# Patient Record
Sex: Female | Born: 1960 | Race: Black or African American | Hispanic: No | Smoking: Former smoker
Health system: Southern US, Community
[De-identification: ages and names within clinical notes are randomized; demographics above are authoritative.]

## PROBLEM LIST (undated history)

## (undated) DIAGNOSIS — F1010 Alcohol abuse, uncomplicated: Secondary | ICD-10-CM

## (undated) DIAGNOSIS — D689 Coagulation defect, unspecified: Secondary | ICD-10-CM

## (undated) DIAGNOSIS — T7840XA Allergy, unspecified, initial encounter: Secondary | ICD-10-CM

## (undated) DIAGNOSIS — C801 Malignant (primary) neoplasm, unspecified: Secondary | ICD-10-CM

## (undated) HISTORY — DX: Allergy, unspecified, initial encounter: T78.40XA

## (undated) HISTORY — DX: Coagulation defect, unspecified: D68.9

## (undated) HISTORY — PX: BREAST SURGERY: SHX581

## (undated) HISTORY — PX: APPENDECTOMY: SHX54

## (undated) HISTORY — DX: Alcohol abuse, uncomplicated: F10.10

## (undated) HISTORY — DX: Malignant (primary) neoplasm, unspecified: C80.1

---

## 1998-07-21 ENCOUNTER — Encounter (HOSPITAL_COMMUNITY): Admission: RE | Admit: 1998-07-21 | Discharge: 1998-10-19 | Payer: Self-pay | Admitting: Psychiatry

## 1998-10-31 ENCOUNTER — Emergency Department (HOSPITAL_COMMUNITY): Admission: EM | Admit: 1998-10-31 | Discharge: 1998-10-31 | Payer: Self-pay | Admitting: Emergency Medicine

## 1999-06-30 ENCOUNTER — Other Ambulatory Visit: Admission: RE | Admit: 1999-06-30 | Discharge: 1999-06-30 | Payer: Self-pay | Admitting: *Deleted

## 1999-12-04 ENCOUNTER — Encounter: Payer: Self-pay | Admitting: Obstetrics and Gynecology

## 1999-12-04 ENCOUNTER — Inpatient Hospital Stay (HOSPITAL_COMMUNITY): Admission: AD | Admit: 1999-12-04 | Discharge: 1999-12-04 | Payer: Self-pay | Admitting: Obstetrics and Gynecology

## 2000-07-04 ENCOUNTER — Other Ambulatory Visit: Admission: RE | Admit: 2000-07-04 | Discharge: 2000-07-04 | Payer: Self-pay | Admitting: Obstetrics and Gynecology

## 2001-06-06 ENCOUNTER — Other Ambulatory Visit: Admission: RE | Admit: 2001-06-06 | Discharge: 2001-06-06 | Payer: Self-pay | Admitting: Obstetrics and Gynecology

## 2001-07-03 ENCOUNTER — Other Ambulatory Visit: Admission: RE | Admit: 2001-07-03 | Discharge: 2001-07-03 | Payer: Self-pay | Admitting: Obstetrics and Gynecology

## 2001-07-03 ENCOUNTER — Encounter (INDEPENDENT_AMBULATORY_CARE_PROVIDER_SITE_OTHER): Payer: Self-pay

## 2002-06-14 ENCOUNTER — Other Ambulatory Visit: Admission: RE | Admit: 2002-06-14 | Discharge: 2002-06-14 | Payer: Self-pay | Admitting: Obstetrics and Gynecology

## 2002-12-05 DIAGNOSIS — C801 Malignant (primary) neoplasm, unspecified: Secondary | ICD-10-CM

## 2002-12-05 HISTORY — DX: Malignant (primary) neoplasm, unspecified: C80.1

## 2003-06-17 ENCOUNTER — Other Ambulatory Visit: Admission: RE | Admit: 2003-06-17 | Discharge: 2003-06-17 | Payer: Self-pay | Admitting: Obstetrics and Gynecology

## 2003-06-26 ENCOUNTER — Encounter: Payer: Self-pay | Admitting: Obstetrics and Gynecology

## 2003-06-26 ENCOUNTER — Ambulatory Visit (HOSPITAL_COMMUNITY): Admission: RE | Admit: 2003-06-26 | Discharge: 2003-06-26 | Payer: Self-pay | Admitting: Obstetrics and Gynecology

## 2003-07-02 ENCOUNTER — Encounter: Admission: RE | Admit: 2003-07-02 | Discharge: 2003-07-02 | Payer: Self-pay | Admitting: Obstetrics and Gynecology

## 2003-07-02 ENCOUNTER — Encounter: Payer: Self-pay | Admitting: Obstetrics and Gynecology

## 2003-07-02 ENCOUNTER — Encounter (INDEPENDENT_AMBULATORY_CARE_PROVIDER_SITE_OTHER): Payer: Self-pay | Admitting: *Deleted

## 2003-07-09 ENCOUNTER — Encounter (HOSPITAL_BASED_OUTPATIENT_CLINIC_OR_DEPARTMENT_OTHER): Payer: Self-pay | Admitting: General Surgery

## 2003-07-09 ENCOUNTER — Encounter: Admission: RE | Admit: 2003-07-09 | Discharge: 2003-07-09 | Payer: Self-pay | Admitting: General Surgery

## 2003-07-11 ENCOUNTER — Encounter (HOSPITAL_BASED_OUTPATIENT_CLINIC_OR_DEPARTMENT_OTHER): Payer: Self-pay | Admitting: General Surgery

## 2003-07-11 ENCOUNTER — Encounter (INDEPENDENT_AMBULATORY_CARE_PROVIDER_SITE_OTHER): Payer: Self-pay | Admitting: Specialist

## 2003-07-11 ENCOUNTER — Ambulatory Visit (HOSPITAL_BASED_OUTPATIENT_CLINIC_OR_DEPARTMENT_OTHER): Admission: RE | Admit: 2003-07-11 | Discharge: 2003-07-11 | Payer: Self-pay | Admitting: General Surgery

## 2003-07-11 ENCOUNTER — Encounter: Admission: RE | Admit: 2003-07-11 | Discharge: 2003-07-11 | Payer: Self-pay | Admitting: General Surgery

## 2003-08-14 ENCOUNTER — Ambulatory Visit (HOSPITAL_COMMUNITY): Admission: RE | Admit: 2003-08-14 | Discharge: 2003-08-14 | Payer: Self-pay | Admitting: Oncology

## 2003-08-14 ENCOUNTER — Encounter: Payer: Self-pay | Admitting: Oncology

## 2003-09-30 ENCOUNTER — Ambulatory Visit: Admission: RE | Admit: 2003-09-30 | Discharge: 2003-12-12 | Payer: Self-pay | Admitting: Radiation Oncology

## 2003-10-09 ENCOUNTER — Encounter: Admission: RE | Admit: 2003-10-09 | Discharge: 2003-10-09 | Payer: Self-pay | Admitting: Radiation Oncology

## 2003-12-30 ENCOUNTER — Ambulatory Visit: Admission: RE | Admit: 2003-12-30 | Discharge: 2003-12-30 | Payer: Self-pay | Admitting: Radiation Oncology

## 2004-05-05 ENCOUNTER — Encounter: Admission: RE | Admit: 2004-05-05 | Discharge: 2004-05-05 | Payer: Self-pay | Admitting: Oncology

## 2004-06-09 ENCOUNTER — Ambulatory Visit: Admission: RE | Admit: 2004-06-09 | Discharge: 2004-06-09 | Payer: Self-pay | Admitting: Radiation Oncology

## 2004-06-30 ENCOUNTER — Other Ambulatory Visit: Admission: RE | Admit: 2004-06-30 | Discharge: 2004-06-30 | Payer: Self-pay | Admitting: Obstetrics and Gynecology

## 2004-10-15 ENCOUNTER — Ambulatory Visit: Payer: Self-pay | Admitting: Family Medicine

## 2005-02-22 ENCOUNTER — Ambulatory Visit: Payer: Self-pay | Admitting: Oncology

## 2005-05-09 ENCOUNTER — Encounter: Admission: RE | Admit: 2005-05-09 | Discharge: 2005-05-09 | Payer: Self-pay | Admitting: Oncology

## 2005-07-05 ENCOUNTER — Other Ambulatory Visit: Admission: RE | Admit: 2005-07-05 | Discharge: 2005-07-05 | Payer: Self-pay | Admitting: Obstetrics and Gynecology

## 2006-02-24 ENCOUNTER — Ambulatory Visit: Payer: Self-pay | Admitting: Oncology

## 2006-05-15 ENCOUNTER — Encounter: Admission: RE | Admit: 2006-05-15 | Discharge: 2006-05-15 | Payer: Self-pay | Admitting: Oncology

## 2006-05-29 ENCOUNTER — Encounter: Admission: RE | Admit: 2006-05-29 | Discharge: 2006-05-29 | Payer: Self-pay | Admitting: Oncology

## 2006-07-10 ENCOUNTER — Other Ambulatory Visit: Admission: RE | Admit: 2006-07-10 | Discharge: 2006-07-10 | Payer: Self-pay | Admitting: Obstetrics and Gynecology

## 2007-02-15 ENCOUNTER — Ambulatory Visit: Payer: Self-pay | Admitting: Oncology

## 2007-02-19 LAB — CBC WITH DIFFERENTIAL/PLATELET
Eosinophils Absolute: 0.1 10*3/uL (ref 0.0–0.5)
MCV: 93.6 fL (ref 81.0–101.0)
MONO%: 8 % (ref 0.0–13.0)
NEUT#: 2.3 10*3/uL (ref 1.5–6.5)
RBC: 3.96 10*6/uL (ref 3.70–5.32)
RDW: 12.9 % (ref 11.3–14.5)
WBC: 4.4 10*3/uL (ref 3.9–10.0)

## 2007-02-19 LAB — COMPREHENSIVE METABOLIC PANEL
ALT: 27 U/L (ref 0–35)
AST: 20 U/L (ref 0–37)
Alkaline Phosphatase: 59 U/L (ref 39–117)
CO2: 24 mEq/L (ref 19–32)
Sodium: 137 mEq/L (ref 135–145)
Total Bilirubin: 0.5 mg/dL (ref 0.3–1.2)
Total Protein: 7.4 g/dL (ref 6.0–8.3)

## 2007-02-19 LAB — CANCER ANTIGEN 27.29: CA 27.29: 18 U/mL (ref 0–39)

## 2007-05-21 ENCOUNTER — Encounter: Admission: RE | Admit: 2007-05-21 | Discharge: 2007-05-21 | Payer: Self-pay | Admitting: Oncology

## 2007-12-10 ENCOUNTER — Emergency Department (HOSPITAL_COMMUNITY): Admission: EM | Admit: 2007-12-10 | Discharge: 2007-12-10 | Payer: Self-pay | Admitting: Emergency Medicine

## 2008-01-24 ENCOUNTER — Ambulatory Visit: Payer: Self-pay | Admitting: Cardiology

## 2008-02-06 ENCOUNTER — Ambulatory Visit: Payer: Self-pay | Admitting: Cardiology

## 2008-02-06 ENCOUNTER — Ambulatory Visit: Payer: Self-pay

## 2008-02-06 ENCOUNTER — Encounter: Payer: Self-pay | Admitting: Cardiology

## 2008-02-14 ENCOUNTER — Ambulatory Visit: Payer: Self-pay | Admitting: Oncology

## 2008-02-18 LAB — CBC WITH DIFFERENTIAL/PLATELET
BASO%: 0.3 % (ref 0.0–2.0)
Basophils Absolute: 0 10*3/uL (ref 0.0–0.1)
EOS%: 3.2 % (ref 0.0–7.0)
Eosinophils Absolute: 0.2 10*3/uL (ref 0.0–0.5)
HCT: 37.3 % (ref 34.8–46.6)
MONO#: 0.3 10*3/uL (ref 0.1–0.9)
NEUT#: 2.7 10*3/uL (ref 1.5–6.5)
NEUT%: 54 % (ref 39.6–76.8)
Platelets: 237 10*3/uL (ref 145–400)
RDW: 12.9 % (ref 11.3–14.5)
WBC: 5 10*3/uL (ref 3.9–10.0)
lymph#: 1.8 10*3/uL (ref 0.9–3.3)

## 2008-02-18 LAB — COMPREHENSIVE METABOLIC PANEL
Albumin: 4.4 g/dL (ref 3.5–5.2)
BUN: 10 mg/dL (ref 6–23)
Calcium: 9.8 mg/dL (ref 8.4–10.5)
Chloride: 107 mEq/L (ref 96–112)
Glucose, Bld: 119 mg/dL — ABNORMAL HIGH (ref 70–99)
Potassium: 4.4 mEq/L (ref 3.5–5.3)

## 2008-05-26 ENCOUNTER — Encounter: Admission: RE | Admit: 2008-05-26 | Discharge: 2008-05-26 | Payer: Self-pay | Admitting: Obstetrics and Gynecology

## 2009-02-12 ENCOUNTER — Ambulatory Visit: Payer: Self-pay | Admitting: Oncology

## 2009-02-16 LAB — CBC WITH DIFFERENTIAL/PLATELET
Basophils Absolute: 0 10*3/uL (ref 0.0–0.1)
EOS%: 2.7 % (ref 0.0–7.0)
LYMPH%: 56.2 % — ABNORMAL HIGH (ref 14.0–49.7)
MONO#: 0.4 10*3/uL (ref 0.1–0.9)
NEUT%: 31 % — ABNORMAL LOW (ref 38.4–76.8)
RBC: 3.94 10*6/uL (ref 3.70–5.45)
WBC: 4 10*3/uL (ref 3.9–10.3)

## 2009-02-16 LAB — COMPREHENSIVE METABOLIC PANEL
AST: 38 U/L — ABNORMAL HIGH (ref 0–37)
Albumin: 3.8 g/dL (ref 3.5–5.2)
BUN: 13 mg/dL (ref 6–23)
Calcium: 8.8 mg/dL (ref 8.4–10.5)
Creatinine, Ser: 0.71 mg/dL (ref 0.40–1.20)

## 2009-02-16 LAB — CANCER ANTIGEN 27.29: CA 27.29: 20 U/mL (ref 0–39)

## 2009-03-02 ENCOUNTER — Encounter: Admission: RE | Admit: 2009-03-02 | Discharge: 2009-03-02 | Payer: Self-pay | Admitting: Oncology

## 2009-03-02 LAB — COMPREHENSIVE METABOLIC PANEL
ALT: 42 U/L — ABNORMAL HIGH (ref 0–35)
AST: 29 U/L (ref 0–37)
Chloride: 103 mEq/L (ref 96–112)
Glucose, Bld: 130 mg/dL — ABNORMAL HIGH (ref 70–99)
Potassium: 3.3 mEq/L — ABNORMAL LOW (ref 3.5–5.3)

## 2009-06-01 ENCOUNTER — Encounter: Admission: RE | Admit: 2009-06-01 | Discharge: 2009-06-01 | Payer: Self-pay | Admitting: Oncology

## 2010-06-02 ENCOUNTER — Encounter: Admission: RE | Admit: 2010-06-02 | Discharge: 2010-06-02 | Payer: Self-pay | Admitting: Obstetrics and Gynecology

## 2010-08-05 ENCOUNTER — Ambulatory Visit: Payer: Self-pay | Admitting: Oncology

## 2010-08-10 LAB — CBC WITH DIFFERENTIAL/PLATELET
BASO%: 0.6 % (ref 0.0–2.0)
Basophils Absolute: 0 10*3/uL (ref 0.0–0.1)
HGB: 12 g/dL (ref 11.6–15.9)
LYMPH%: 53.6 % — ABNORMAL HIGH (ref 14.0–49.7)
MCH: 30.7 pg (ref 25.1–34.0)
MCHC: 33.4 g/dL (ref 31.5–36.0)
MCV: 91.9 fL (ref 79.5–101.0)
MONO%: 11.9 % (ref 0.0–14.0)
NEUT#: 0.9 10*3/uL — ABNORMAL LOW (ref 1.5–6.5)
RDW: 13.6 % (ref 11.2–14.5)
lymph#: 1.6 10*3/uL (ref 0.9–3.3)

## 2010-08-10 LAB — MORPHOLOGY

## 2010-08-10 LAB — SEDIMENTATION RATE: Sed Rate: 26 mm/hr — ABNORMAL HIGH (ref 0–22)

## 2010-08-15 LAB — HEMOCHROMATOSIS DNA-PCR(C282Y,H63D)

## 2010-08-19 ENCOUNTER — Encounter: Admission: RE | Admit: 2010-08-19 | Discharge: 2010-08-19 | Payer: Self-pay | Admitting: Family Medicine

## 2010-08-31 LAB — CBC WITH DIFFERENTIAL/PLATELET
BASO%: 0.2 % (ref 0.0–2.0)
Basophils Absolute: 0 10*3/uL (ref 0.0–0.1)
HCT: 35.4 % (ref 34.8–46.6)
HGB: 11.2 g/dL — ABNORMAL LOW (ref 11.6–15.9)
LYMPH%: 38.8 % (ref 14.0–49.7)
MCH: 29.5 pg (ref 25.1–34.0)
MCV: 93.2 fL (ref 79.5–101.0)
MONO#: 0.4 10*3/uL (ref 0.1–0.9)
MONO%: 8.1 % (ref 0.0–14.0)
NEUT#: 2.3 10*3/uL (ref 1.5–6.5)
NEUT%: 50.1 % (ref 38.4–76.8)
RBC: 3.8 10*6/uL (ref 3.70–5.45)

## 2010-08-31 LAB — MORPHOLOGY: PLT EST: ADEQUATE

## 2010-08-31 LAB — SEDIMENTATION RATE: Sed Rate: 31 mm/hr — ABNORMAL HIGH (ref 0–22)

## 2010-08-31 LAB — FERRITIN: Ferritin: 331 ng/mL — ABNORMAL HIGH (ref 10–291)

## 2010-08-31 LAB — CHCC SMEAR

## 2010-09-07 ENCOUNTER — Ambulatory Visit: Payer: Self-pay | Admitting: Oncology

## 2011-04-19 NOTE — Assessment & Plan Note (Signed)
[facility]                            CARDIOLOGY OFFICE NOTE   NAME:Melinda Ferrell, Melinda Ferrell                       MRN:          [id number]  DATE:01/24/2008                            DOB:          January 10, 1961    Melinda Ferrell is a very pleasant 50 year old female whom I am asked to  evaluate for bradycardia and PVCs.  She has no prior cardiac history.  She typically does not have dyspnea on exertion, orthopnea, PND, pedal  edema, palpitations, presyncope, syncope, or exertional chest pain.  On  December 17, 2007, the patient was at work and apparently had a seizure.  She states she began to feel dizzy and then passed out.  She was out for  approximately 8 minutes.  A coworker witnessed her to have generalized  seizures.  She was taken to the emergency room.  At that time, she had a  CBC that showed a white blood cell count of 6, with a hemoglobin of 13,  hematocrit 38.4, platelet count was 312.  She also had one set of  markers that were normal.  Her creatinine was 0.9, potassium was 3.9.  She subsequently was referred to Dr. Nash Shearer for evaluation.  She had an  MRI and an EEG.  I do not have those results available.  However, during  the EEG, she apparently was noted to have pauses of 1-2 seconds and also  PVCs, and cardiology was asked to further evaluate.  Note before the  patient had her seizure she did not have chest pain, palpitations, or  shortness of breath.  There has been no history of syncope prior to  that.   MEDICATIONS:  1. A multivitamin daily.  2. Ambien 10 mg p.o. at bedtime.  3. Aspirin 81 mg p.o. daily.  4. She takes ibuprofen, Vicodin, Tussionex, Tessalon, and Benadryl as      needed.   She has an allergy to SULFA.   SOCIAL HISTORY:  She does smoke.  She consumes 2-3 alcoholic beverages  on the weekends, but otherwise does not drink.  There is no history of  cocaine use.   FAMILY HISTORY:  Negative for coronary artery disease or sudden  death.   PAST MEDICAL HISTORY:  1. There is no diabetes mellitus, hypertension, or hyperlipidemia.  2. She does have a history of breast cancer and is status post      lumpectomy.  She also was treated with radiation and chemotherapy.      She does not know the names of her chemotherapeutic agents.  3. She has had a prior appendectomy.   REVIEW OF SYSTEMS:  She denies any headaches, fevers, or chills.  There  is no productive cough or hemoptysis.  There is no dysphagia,  odynophagia, melena, or hematochezia.  There is no dysuria or hematuria.  There is no rash or seizure activity.  There is no orthopnea, PND, or  pedal edema.  The remaining systems are negative.   PHYSICAL EXAMINATION TODAY:  VITAL SIGNS:  Blood pressure of 111/74,  pulse of 73.  She weighs 156 pounds.  She  is well-developed, well-  nourished, in no acute distress.  Skin is warm and dry.  The patient  does not appear to be depressed, and there is no peripheral clubbing.  BACK:  Normal.  HEENT:  Normal, with normal eyelids.  NECK:  Supple, with a normal upstroke bilaterally, and no bruits noted.  There is no jugular venous distention, and no thyromegaly is noted.  CHEST:  Clear to auscultation, with normal expansion.  CARDIOVASCULAR:  Reveals a regular rhythm.  Normal S1 and S2.  There are  no murmurs, rubs, or gallops noted.  There is no change with Valsalva.  Her PMI is not palpated.  ABDOMEN:  Nontender, nondistended.  Positive bowel sounds.  No  hepatosplenomegaly.  No masses appreciated.  There is no abdominal  bruit.  She has 2+ femoral pulses bilaterally.  No bruits.  EXTREMITIES:  Show no edema, and I could palpate no cords.  She has 2+  dorsalis pedis pulses bilaterally.  NEUROLOGIC:  Grossly intact.  Her electrocardiogram shows a sinus rhythm  at a rate of 73.  There are nonspecific T-wave changes.   DIAGNOSES:  1. Recent premature ventricular contractions on EEG.  She is not      having symptoms of  palpitations, shortness of breath, or chest      pain.  We will plan to proceed with an echocardiogram to quantify      her LV function.  This is particularly in light of her history of      breast cancer and chemotherapy.  However, I doubt cardiomyopathy,      and if her LV function is normal, then PVCs are benign, and we have      explained that.  We will also schedule her to have a 24-hour Holter      monitor.  We will see her back in approximately 6 weeks to review      the above.  2. Bradycardia.  There are apparently 1-2 second pauses.  Note there      is no history of syncope other than at the time of her seizure.      This does sound to be a seizure, as it lasted for approximately 10      minutes and she was witnessed to have tonic-clonic episodes.  She      also apparently was incontinent, although she did not bite her      tongue.  We will not pursue this further if there are no      significant pauses on her monitor.  3. History of breast cancer.  4. New onset seizures.  Per Dr. Nash Shearer.  5. Tobacco abuse.  We discussed the importance of discontinuing this.     Melinda Frieze Jens Som, MD, [facility]  Electronically Signed    BSC/MedQ  DD: 01/24/2008  DT: 01/25/2008  Job #: [id number]   cc:   Estanislado Pandy, MD

## 2011-04-22 NOTE — Assessment & Plan Note (Signed)
[facility]                            CARDIOLOGY OFFICE NOTE   NAME:Melinda Ferrell, Melinda Ferrell                       MRN:          [id number]  DATE:04/03/2008                            DOB:          March 28, 1961    This is a note concerning Melinda Ferrell.  She is a 50 year old female  that I recently saw on December 24, 2007.  At that time, she was sent for  evaluation of bradycardia and PVCs.  Per my notes, she apparently  recently had a seizure in January and was out for approximately 8  minutes.  She was evaluated by neurology and apparently during her EEG,  she was noted to have pauses of 1-2 seconds and PVCs.  Because of the  above, we scheduled her to have a Holter monitor.  This was performed on  February 06, 2008.  The patient was found to have sinus with occasional 2-1  block.  There were no prolonged pauses.  I did review this with Dr.  Graciela Husbands.  It is not clear that she was having symptoms from this, but  certainly she could have other pauses that could cause syncope/seizures  with postictal-type symptoms.  I think this is unlikely, but I think it  needs to be followed.  We did contact the patient earlier today (one of  the nurses here in the office).  The patient stated that she had  reviewed her monitor with her referring neurologist and did not feel the  need to come back for further cardiology evaluation.  I did attempt to  contact her this evening and left her a voice mail.  This should be  followed up on in the future, and we will try to again contact her  tomorrow.     Madolyn Frieze Jens Som, MD, [facility]  Electronically Signed    BSC/MedQ  DD: 04/03/2008  DT: 04/03/2008  Job #: [id number]

## 2011-04-22 NOTE — Op Note (Signed)
NAME:  JENNIFER, PAYES                          ACCOUNT NO.:  [id number]   MEDICAL RECORD NO.:  [id number]                   PATIENT TYPE:  AMB   LOCATION:  DSC                                  FACILITY:  [facility]   PHYSICIAN:  Leonie Man, M.D.                DATE OF BIRTH:  08/13/1961   DATE OF PROCEDURE:  07/11/2003  DATE OF DISCHARGE:                                 OPERATIVE REPORT   PREOPERATIVE DIAGNOSIS:  Carcinoma left breast.   POSTOPERATIVE DIAGNOSIS:  Carcinoma left breast.   PROCEDURE:  Lumpectomy on the left and left sentinel lymph node dissection.   SURGEON:  Leonie Man, M.D.   ASSISTANT:  Nurse.   ANESTHESIA:  General.   INDICATIONS FOR PROCEDURE:  The patient is a 50 year old female who on her  first mammogram was noted to have a suspicious lesion which is biopsied  serotactically and shows invasive and infected carcinoma. The lesions is  small and amendable to lumpectomy and she comes to the operating room now  after the risks and potential benefits of surgery have been fully discussed.  All questions as to the details of the procedure have been answered and  consent obtained.   DESCRIPTION OF PROCEDURE:  Following the induction of satisfactory general  anesthesia, the patient was positioned supinely and the left breast and  axilla are prepped and draped to be included in a sterile operative field. I  infiltrated the periareolar region with 5 mL of Lymphazurin dye. It should  be noted that the patient was previously injected with radionucleotide prior  to coming to the operating room. She also underwent needle localization  prior to coming to the operating room. After massaging the Lymphazurin for  approximately five minutes, a transverse incision was made into the breast  overlying the region of the suspicious tumor and following the localizing  needle down to the chest wall where a large wedge of breast tissue was  removed in its entirety and  forwarded for specimen mammography. Specimen  mammography showed that the lesion was well contained within the specimen.  The specimen was then forwarded for pathology. The Neoprobe was used to  locate the area of highest counts within the axilla and a transverse  incision was made over the lower axilla deep and through the skin and  subcutaneous tissue using the Neoprobe to guide me to the sentinel node. At  that point, we had counts of greater than 2000 and the sentinel node was  located and noted to be blue. The sentinel node was dissected free and  forwarded for pathologic evaluation. Another subjacent node which was not  blue and not hot was also removed and forwarded for pathologic evaluation.  Touch prep of the sentinel node was negative for metastatic tumor and touch  prep of the lumpectomy showed that there were no evidence of tumor that was  transected at the margins.  Sponge, instrument and sharp counts were then  verified. Hemostasis within both wounds was assured was assured with  electrocautery. The subcutaneous tissues of each wound was then closed with  interrupted 3-0 Vicryl sutures and the skin of each of the  wounds was closed with a running 5-0 Monocryl suture. The wounds were  reinforced with Steri-Strips, sterile dressings were applied. Anesthetic  reversed and the patient removed from the operating room to the recovery  room in stable condition. She tolerated the procedure well.                                               Leonie Man, M.D.    PB/MEDQ  D:  07/11/2003  T:  07/12/2003  Job:  [id number]

## 2011-04-27 ENCOUNTER — Other Ambulatory Visit: Payer: Self-pay | Admitting: Obstetrics and Gynecology

## 2011-04-27 DIAGNOSIS — Z1231 Encounter for screening mammogram for malignant neoplasm of breast: Secondary | ICD-10-CM

## 2011-05-18 ENCOUNTER — Ambulatory Visit: Payer: Self-pay

## 2011-06-17 ENCOUNTER — Ambulatory Visit
Admission: RE | Admit: 2011-06-17 | Discharge: 2011-06-17 | Disposition: A | Discharge status: Home / Self Care | Payer: 59 | Source: Ambulatory Visit | Attending: Obstetrics and Gynecology | Admitting: Obstetrics and Gynecology

## 2011-06-17 DIAGNOSIS — Z1231 Encounter for screening mammogram for malignant neoplasm of breast: Secondary | ICD-10-CM

## 2011-08-24 LAB — I-STAT 8, (EC8 V) (CONVERTED LAB)
BUN: 18
Bicarbonate: 17.8 — ABNORMAL LOW
Chloride: 109
Glucose, Bld: 77
Hemoglobin: 15
Potassium: 3.9
Sodium: 135

## 2011-08-24 LAB — CBC
HCT: 38.4
Hemoglobin: 13
RDW: 13.7

## 2011-08-24 LAB — POCT PREGNANCY, URINE
Operator id: 234501
Preg Test, Ur: NEGATIVE

## 2011-08-24 LAB — RAPID URINE DRUG SCREEN, HOSP PERFORMED
Cocaine: NOT DETECTED
Opiates: NOT DETECTED

## 2011-08-24 LAB — POCT CARDIAC MARKERS
CKMB, poc: <1 — ABNORMAL LOW
Myoglobin, poc: 94.5
Operator id: 234501

## 2011-08-24 LAB — DIFFERENTIAL
Basophils Absolute: 0
Eosinophils Relative: 0
Lymphocytes Relative: 24
Monocytes Absolute: 0.3

## 2011-08-24 LAB — ETHANOL: Alcohol, Ethyl (B): 36 — ABNORMAL HIGH

## 2012-02-23 ENCOUNTER — Other Ambulatory Visit: Payer: Self-pay

## 2012-02-23 MED ORDER — CLONAZEPAM 0.5 MG PO TABS: 0.5000 mg | ORAL_TABLET | Freq: Two times a day (BID) | ORAL | Status: DC | PRN

## 2012-02-29 ENCOUNTER — Other Ambulatory Visit: Payer: Self-pay

## 2012-02-29 MED ORDER — MELOXICAM 7.5 MG PO TABS: 7.5000 mg | ORAL_TABLET | Freq: Every day | ORAL | Status: DC

## 2012-03-22 ENCOUNTER — Other Ambulatory Visit: Payer: Self-pay | Admitting: Physician Assistant

## 2012-03-24 NOTE — Telephone Encounter (Signed)
Needs OV for additional refills.

## 2012-04-11 ENCOUNTER — Ambulatory Visit (INDEPENDENT_AMBULATORY_CARE_PROVIDER_SITE_OTHER): Payer: 59 | Admitting: Family Medicine

## 2012-04-11 VITALS — BP 96/64 | HR 81 | Temp 98.1°F | Resp 16 | Ht 62.0 in | Wt 175.0 lb

## 2012-04-11 DIAGNOSIS — F419 Anxiety disorder, unspecified: Secondary | ICD-10-CM

## 2012-04-11 DIAGNOSIS — N946 Dysmenorrhea, unspecified: Secondary | ICD-10-CM

## 2012-04-11 DIAGNOSIS — Z0000 Encounter for general adult medical examination without abnormal findings: Secondary | ICD-10-CM

## 2012-04-11 LAB — POCT CBC
Granulocyte percent: 41.7 %G (ref 37–80)
HCT, POC: 37.7 % (ref 37.7–47.9)
Hemoglobin: 11.9 g/dL — AB (ref 12.2–16.2)
MPV: 7.7 fL (ref 0–99.8)
POC Granulocyte: 1.9 — AB (ref 2–6.9)
RBC: 4.15 M/uL (ref 4.04–5.48)

## 2012-04-11 LAB — LIPID PANEL
Cholesterol: 197 mg/dL (ref 0–200)
LDL Cholesterol: 128 mg/dL — ABNORMAL HIGH (ref 0–99)
Total CHOL/HDL Ratio: 4.4 Ratio

## 2012-04-11 LAB — COMPREHENSIVE METABOLIC PANEL
ALT: 12 U/L (ref 0–35)
Albumin: 3.9 g/dL (ref 3.5–5.2)
CO2: 26 mEq/L (ref 19–32)
Glucose, Bld: 96 mg/dL (ref 70–99)
Potassium: 4.2 mEq/L (ref 3.5–5.3)
Sodium: 141 mEq/L (ref 135–145)
Total Protein: 6.7 g/dL (ref 6.0–8.3)

## 2012-04-11 LAB — TSH: TSH: 3.178 u[IU]/mL (ref 0.350–4.500)

## 2012-04-11 MED ORDER — CLONAZEPAM 0.5 MG PO TABS: ORAL_TABLET | ORAL | Status: DC

## 2012-04-11 MED ORDER — MELOXICAM 7.5 MG PO TABS: 7.5000 mg | ORAL_TABLET | Freq: Every day | ORAL | Status: DC

## 2012-04-11 NOTE — Progress Notes (Signed)
Patient Name: Melinda Ferrell Date of Birth: 07/31/1961 Medical Record Number: [id number] Gender: female Date of Encounter: 04/11/2012  History of Present Illness:  Melinda Ferrell is a 51 y.o. very pleasant female patient who presents with the following:  Here for a CPE.  She is doing very well.  Last mammo was in June 2012 and she plans to do this summer as well.   Currently fasting- we will do labs today.    Sees Dr. Stefano Gaul for GYN care- she has implanon but her bleeding is a lot better.  She had her GYN exam last fall and had a negative pap.  She uses mobic as needed for pain  She is still off alcohol. Smokes a little bit sometimes but is working on this- she has chantix and may decide to use it Tetanus 2011, all immunizations UTD  She works from 11 am to 7:30 pm at [employer]- she does use clonazepam every night for sleep.  She is taking all 3 tablets at night before bed.  Melinda Ferrell wants to know if she "should go up" on her dose.  Also wonders if she should get something else "really for anxiety:" Melinda Ferrell is also going on a cruise soon and is worried that she will feel clausterphobic  There is no problem list on file for this patient.  No past medical history on file. No past surgical history on file. History  Substance Use Topics  . Smoking status: Current Some Day Smoker -- 0.2 packs/day    Types: Cigarettes  . Smokeless tobacco: Not on file  . Alcohol Use: Not on file   No family history on file. Allergies  Allergen Reactions  . Sulfa Antibiotics     Medication list has been reviewed and updated.  Review of Systems: As per HPI- otherwise negative.   Physical Examination: Filed Vitals:   04/11/12 0754  BP: 96/64  Pulse: 81  Temp: 98.1 F (36.7 C)  Resp: 16  Height: 5\' 2"  (1.575 m)  Weight: 175 lb (79.379 kg)    Body mass index is 32.01 kg/(m^2).  GEN: WDWN, NAD, Non-toxic, A & O x 3, obese HEENT: Atraumatic, Normocephalic. Neck supple. No masses, No LAD.  Tm,  oropharynx wnl Ears and Nose: No external deformity. CV: RRR, No M/G/R. No JVD. No thrill. No extra heart sounds. PULM: CTA B, no wheezes, crackles, rhonchi. No retractions. No resp. distress. No accessory muscle use. ABD: S, NT, ND, +BS. No rebound. No HSM. EXTR: No c/c/e NEURO Normal gait.  PSYCH: Normally interactive. Conversant. Not depressed or anxious appearing.  Calm demeanor.  Defer GU and breast exams as these are done per OBG  Results for orders placed in visit on 04/11/12  POCT CBC      Component Value Range   WBC 4.6  4.6 - 10.2 (K/uL)   Lymph, poc 2.4  0.6 - 3.4    POC LYMPH PERCENT 51.7 (*) 10 - 50 (%L)   MID (cbc) 0.3  0 - 0.9    POC MID % 6.6  0 - 12 (%M)   POC Granulocyte 1.9 (*) 2 - 6.9    Granulocyte percent 41.7  37 - 80 (%G)   RBC 4.15  4.04 - 5.48 (M/uL)   Hemoglobin 11.9 (*) 12.2 - 16.2 (g/dL)   HCT, POC 57.8  46.9 - 47.9 (%)   MCV 90.8  80 - 97 (fL)   MCH, POC 28.7  27 - 31.2 (pg)   MCHC 31.6 (*)  31.8 - 35.4 (g/dL)   RDW, POC 57.8     Platelet Count, POC 336  142 - 424 (K/uL)   MPV 7.7  0 - 99.8 (fL)     Assessment and Plan: 1. Anxiety  clonazePAM (KLONOPIN) 0.5 MG tablet  2. Physical exam, annual  POCT CBC, Comprehensive metabolic panel, TSH, Lipid panel  3. Dysmenorrhea  meloxicam (MOBIC) 7.5 MG tablet   Discussed benzo use with Melinda Ferrell.  Explained that especially for a person with a history of alcohol abuse, overusing these medications is not a good idea.  In fact, as she is doing well, sleeping well and is approaching one full year of sobriety we should try and have her decrease her clonazepam use, not increase it.  She is actually excited about this idea and plans to start tapering her dose of nighttime clonazepam and see how she does.  She states that she may just be taking it out of habit and that she hopes she will do well with a decreased dose.  Her trip is not until November- she can call me if she is concerned about needing an anxiolytic for her  trip, but again for the reasons above I would encourage her to try and manage stress without the use of sedatives.  However, she could use a small amount of her klonopin during the day as well if needed.    Will follow- up further with her labs as above.

## 2012-04-12 ENCOUNTER — Encounter: Payer: Self-pay | Admitting: Family Medicine

## 2012-04-13 ENCOUNTER — Telehealth: Payer: Self-pay

## 2012-04-13 NOTE — Telephone Encounter (Signed)
Pt is calling for lab results from Dr. Patsy Lager  Work [phone] Or lmom cell [phone]

## 2012-04-14 NOTE — Telephone Encounter (Signed)
Dr. Patsy Lager sent pt a letter. Called pt and read it to her

## 2012-05-15 ENCOUNTER — Other Ambulatory Visit: Payer: Self-pay | Admitting: Obstetrics and Gynecology

## 2012-05-15 DIAGNOSIS — Z1231 Encounter for screening mammogram for malignant neoplasm of breast: Secondary | ICD-10-CM

## 2012-06-19 ENCOUNTER — Ambulatory Visit
Admission: RE | Admit: 2012-06-19 | Discharge: 2012-06-19 | Disposition: A | Discharge status: Home / Self Care | Payer: 59 | Source: Ambulatory Visit | Attending: Obstetrics and Gynecology | Admitting: Obstetrics and Gynecology

## 2012-06-19 DIAGNOSIS — Z1231 Encounter for screening mammogram for malignant neoplasm of breast: Secondary | ICD-10-CM

## 2012-06-28 ENCOUNTER — Other Ambulatory Visit: Payer: Self-pay | Admitting: Obstetrics and Gynecology

## 2012-06-29 ENCOUNTER — Telehealth: Payer: Self-pay

## 2012-06-29 ENCOUNTER — Other Ambulatory Visit: Payer: Self-pay | Admitting: Obstetrics and Gynecology

## 2012-06-29 ENCOUNTER — Telehealth: Payer: Self-pay | Admitting: Obstetrics and Gynecology

## 2012-06-29 NOTE — Telephone Encounter (Signed)
Pt called back to confirm her Rx for vicodin exp and needs new Rx. Message will be sent to Dr.Stringer for approval.  [facility] CMA

## 2012-06-29 NOTE — Telephone Encounter (Signed)
Patient called.  No answer.  Message left that her prescription was called to write aid. Vicodin 5/500  #20  no refills  one or 2 tablets every 4 hours as needed for pain. Annual exam in August 2013. Dr. Stefano Gaul

## 2012-06-29 NOTE — Telephone Encounter (Signed)
Rx request for Vicodin 5-500mg  from [facility] on [address] Pt was called to discuss medication Pt was last seen 11/30/11 by AVS. Will await pt call back.  [facility] CMA

## 2012-07-23 ENCOUNTER — Telehealth: Payer: Self-pay | Admitting: Obstetrics and Gynecology

## 2012-07-23 NOTE — Telephone Encounter (Signed)
Tc to pt regarding msg.  Pt states her AEX is sched for 08/28/12, and wanted to get her flu shot @ that time, pt wanted to know if they would be available.  Informed pt per DF, the flu shots may or may not be available @ that time, is unsure right now.  Pt voices understanding and will call back to see if they are available or if she will need to make arrangements some place else.

## 2012-08-28 ENCOUNTER — Encounter: Payer: Self-pay | Admitting: Obstetrics and Gynecology

## 2012-08-28 ENCOUNTER — Ambulatory Visit (INDEPENDENT_AMBULATORY_CARE_PROVIDER_SITE_OTHER): Payer: 59 | Admitting: Obstetrics and Gynecology

## 2012-08-28 VITALS — BP 112/78 | Resp 16 | Ht 62.5 in | Wt 178.0 lb

## 2012-08-28 DIAGNOSIS — Z202 Contact with and (suspected) exposure to infections with a predominantly sexual mode of transmission: Secondary | ICD-10-CM

## 2012-08-28 DIAGNOSIS — Z224 Carrier of infections with a predominantly sexual mode of transmission: Secondary | ICD-10-CM

## 2012-08-28 DIAGNOSIS — N946 Dysmenorrhea, unspecified: Secondary | ICD-10-CM

## 2012-08-28 DIAGNOSIS — Z2089 Contact with and (suspected) exposure to other communicable diseases: Secondary | ICD-10-CM

## 2012-08-28 DIAGNOSIS — Z01419 Encounter for gynecological examination (general) (routine) without abnormal findings: Secondary | ICD-10-CM

## 2012-08-28 MED ORDER — IBUPROFEN 800 MG PO TABS: 800.0000 mg | ORAL_TABLET | Freq: Three times a day (TID) | ORAL | Status: DC | PRN

## 2012-08-28 MED ORDER — HYDROCODONE-ACETAMINOPHEN 5-500 MG PO TABS: 1.0000 | ORAL_TABLET | ORAL | Status: DC | PRN

## 2012-08-28 NOTE — Progress Notes (Signed)
Subjective:    Melinda Ferrell is a 51 y.o. female G2P1 who presents for annual exam. The patient had an Implanon placed in March 2011 because of severe dysmenorrhea and fibroids. She has irregular periods at this point but continues to have pain.  The following portions of the patient's history were reviewed and updated as appropriate: allergies, current medications, past family history, past medical history, past social history, past surgical history and problem list.  Review of Systems Pertinent items are noted in HPI. Gastrointestinal:No change in bowel habits, no abdominal pain, no rectal bleeding Genitourinary:negative for dysuria, frequency, hematuria, nocturia and urinary incontinence    Objective:     BP 112/78  Resp 16  Ht 5' 2.5" (1.588 m)  Wt 178 lb (80.74 kg)  BMI 32.04 kg/m2  Weight:  Wt Readings from Last 1 Encounters:  08/28/12 178 lb (80.74 kg)     BMI: Body mass index is 32.04 kg/(m^2). General Appearance: Alert, appropriate appearance for age. No acute distress HEENT: Grossly normal Neck / Thyroid: Supple, no masses, nodes or enlargement Lungs: clear to auscultation bilaterally Back: No CVA tenderness Breast Exam: No masses or nodes.No dimpling, nipple retraction or discharge. Cardiovascular: Regular rate and rhythm. S1, S2, no murmur Gastrointestinal: Soft, non-tender, no masses or organomegaly  ++++++++++++++++++++++++++++++++++++++++++++++++++++++++  Pelvic Exam: External genitalia: normal general appearance Vaginal: normal without tenderness, induration or masses Cervix: normal appearance Adnexa: normal bimanual exam Uterus: upper limits normal size, nontender Rectovaginal: normal rectal, no masses  ++++++++++++++++++++++++++++++++++++++++++++++++++++++++  Lymphatic Exam: Non-palpable nodes in neck, clavicular, axillary, or inguinal regions  Psychiatric: Alert and oriented, appropriate affect Extremities: Implanon and right upper arm       Assessment:    Normal gyn exam fibroids  Dysmenorrhea  Overweight or obese: Yes  Pelvic relaxation: No  Menopausal symptoms: Yes. Severe: No.  Rule out STDs   Plan:    Chlamydia specimen. GC specimen. Pap smear.   Follow-up:  in 3 month(s)  STD screen request: GC, chlamydia, HSV, HIV,  RPR: Yes,  HBsAg: Yes.  Hepatitis C: Yes.  The updated Pap smear screening guidelines were discussed with the patient. The patient requested that I obtain a Pap smear: Yes.  Kegel exercises discussed: Yes.  Proper diet and regular exercise were reviewed.  Annual mammograms recommended starting at age 81. Proper breast care was discussed.  Screening colonoscopy is recommended beginning at age 71.  Regular health maintenance was reviewed.  Sleep hygiene was discussed.  Adequate calcium and vitamin D intake was emphasized.  Leonard Schwartz M.D.   Regular Periods: no Mammogram: yes  Monthly Breast Ex.: yes Exercise: yes "Everyday"  Tetanus < 10 years: yes Seatbelts: yes  NI. Bladder Functn.: yes Abuse at home: no  Daily BM's: yes Stressful Work: yes  Healthy Diet: yes Sigmoid-Colonoscopy: Never  Calcium: no Medical problems this year: None per pt.    LAST PAP:08/03/2009  Contraception: Implanon   Mammogram:  05/2012 "WNL"  PCP: Dr.Jessica Copland  PMH: No Changes  FMH: No Changes  Last Bone Scan: No Changes

## 2012-08-30 LAB — PAP IG, CT-NG, RFX HPV ASCU
Chlamydia Probe Amp: NEGATIVE
GC Probe Amp: NEGATIVE

## 2012-08-31 LAB — HUMAN PAPILLOMAVIRUS, HIGH RISK: HPV DNA High Risk: NOT DETECTED

## 2012-09-03 NOTE — Progress Notes (Signed)
Quick Note:  Send atypical Pap smear letter with a note to return for repeat Pap in 12 months. ______ 

## 2012-09-04 ENCOUNTER — Encounter: Payer: Self-pay | Admitting: Obstetrics and Gynecology

## 2012-11-12 ENCOUNTER — Encounter: Payer: 59 | Admitting: Obstetrics and Gynecology

## 2012-11-13 ENCOUNTER — Encounter: Payer: 59 | Admitting: Obstetrics and Gynecology

## 2012-11-14 ENCOUNTER — Encounter: Payer: 59 | Admitting: Obstetrics and Gynecology

## 2012-11-16 ENCOUNTER — Encounter: Payer: 59 | Admitting: Obstetrics and Gynecology

## 2012-11-19 ENCOUNTER — Encounter: Payer: Self-pay | Admitting: Obstetrics and Gynecology

## 2012-11-19 ENCOUNTER — Ambulatory Visit (INDEPENDENT_AMBULATORY_CARE_PROVIDER_SITE_OTHER): Payer: 59 | Admitting: Obstetrics and Gynecology

## 2012-11-19 VITALS — BP 102/60 | Wt 176.0 lb

## 2012-11-19 DIAGNOSIS — N946 Dysmenorrhea, unspecified: Secondary | ICD-10-CM

## 2012-11-19 NOTE — Progress Notes (Signed)
HISTORY OF PRESENT ILLNESS  Melinda Ferrell is a 51 y.o. year old female,G2P1, who presents for a problem visit. The patient has a history of dysmenorrhea.  An Implanon was placed in March of 2011.  She takes Vicodin and ibuprofen when needed.  The above has helped, but she still occasionally misses work. Her most recent Pap smear showed ASCUS.  HPV was negative.  Subjective:  The patient feels good today.  Objective:  BP 102/60  Wt 176 lb (79.833 kg)   General: no distress GI: soft and nontender back: No CVA tenderness  Exam deferred.  Assessment:  Dysmenorrhea  ASCUS Pap smear with negative HPV  Plan:  ASCUS Pap smear discussed.  Repeat 1 year.  Continue current therapy for dysmenorrhea.  Remove and reinsert Nexplanon in March 2014.  Return to office in 3 month(s).   Leonard Schwartz M.D.  11/19/2012 9:28 AM

## 2012-11-21 ENCOUNTER — Encounter: Payer: 59 | Admitting: Obstetrics and Gynecology

## 2012-11-24 ENCOUNTER — Other Ambulatory Visit: Payer: Self-pay | Admitting: Family Medicine

## 2012-12-26 ENCOUNTER — Other Ambulatory Visit: Payer: Self-pay | Admitting: Family Medicine

## 2012-12-26 ENCOUNTER — Other Ambulatory Visit: Payer: Self-pay | Admitting: Obstetrics and Gynecology

## 2012-12-27 NOTE — Telephone Encounter (Signed)
Prescription history reviewed with the pharmacist at [facility]. Her last refill of her medication was on 10/21/2012. A prescription was given for Vicodin 5/325. #50. No refills. One or 2 tablets every 4 hours as needed for pain. The patient is to return to our office in March 2014.  Dr. Stefano Gaul

## 2013-01-19 ENCOUNTER — Other Ambulatory Visit: Payer: Self-pay

## 2013-02-01 ENCOUNTER — Other Ambulatory Visit: Payer: Self-pay | Admitting: Family Medicine

## 2013-02-19 ENCOUNTER — Other Ambulatory Visit: Payer: Self-pay

## 2013-02-19 DIAGNOSIS — Z1231 Encounter for screening mammogram for malignant neoplasm of breast: Secondary | ICD-10-CM

## 2013-03-09 ENCOUNTER — Other Ambulatory Visit: Payer: Self-pay | Admitting: Family Medicine

## 2013-03-09 DIAGNOSIS — F4323 Adjustment disorder with mixed anxiety and depressed mood: Secondary | ICD-10-CM

## 2013-03-09 MED ORDER — CLONAZEPAM 0.5 MG PO TABS: ORAL_TABLET | ORAL | Status: DC

## 2013-03-09 NOTE — Telephone Encounter (Signed)
Called and spoke with her- she apparently did not get the message that she needed a recheck prior to further refills.  She is having a hard time as a friend was murdered recently.  I will give her 30 clonazepam, but a visit is needed prior to more.  She will come and see [place] soon

## 2013-03-09 NOTE — Addendum Note (Signed)
Addended by: Abbe Amsterdam C on: 03/09/2013 07:45 PM   Modules accepted: Orders

## 2013-03-12 MED ORDER — CLONAZEPAM 0.5 MG PO TABS: ORAL_TABLET | ORAL | Status: DC

## 2013-03-12 NOTE — Telephone Encounter (Signed)
Called in rx for #30 clonazepam- thought this was done on the 4th but pharmacy did not have record of rx.  Send message to pt- please come for a recheck prior to further rx

## 2013-03-18 ENCOUNTER — Ambulatory Visit (INDEPENDENT_AMBULATORY_CARE_PROVIDER_SITE_OTHER): Payer: 59 | Admitting: Family Medicine

## 2013-03-18 VITALS — BP 104/70 | HR 87 | Temp 98.1°F | Resp 16 | Ht 62.0 in | Wt 177.0 lb

## 2013-03-18 DIAGNOSIS — J309 Allergic rhinitis, unspecified: Secondary | ICD-10-CM

## 2013-03-18 DIAGNOSIS — N946 Dysmenorrhea, unspecified: Secondary | ICD-10-CM

## 2013-03-18 DIAGNOSIS — D62 Acute posthemorrhagic anemia: Secondary | ICD-10-CM

## 2013-03-18 DIAGNOSIS — G4700 Insomnia, unspecified: Secondary | ICD-10-CM

## 2013-03-18 DIAGNOSIS — F4323 Adjustment disorder with mixed anxiety and depressed mood: Secondary | ICD-10-CM

## 2013-03-18 DIAGNOSIS — Z0000 Encounter for general adult medical examination without abnormal findings: Secondary | ICD-10-CM

## 2013-03-18 LAB — POCT CBC
Hemoglobin: 10.8 g/dL — AB (ref 12.2–16.2)
Lymph, poc: 2.1 (ref 0.6–3.4)
MCH, POC: 27.4 pg (ref 27–31.2)
MCHC: 29.9 g/dL — AB (ref 31.8–35.4)
MCV: 91.6 fL (ref 80–97)
MPV: 7.6 fL (ref 0–99.8)
POC MID %: 10.1 %M (ref 0–12)
RBC: 3.94 M/uL — AB (ref 4.04–5.48)
WBC: 4.6 10*3/uL (ref 4.6–10.2)

## 2013-03-18 LAB — LIPID PANEL
Cholesterol: 204 mg/dL — ABNORMAL HIGH (ref 0–200)
HDL: 45 mg/dL (ref >39)
Total CHOL/HDL Ratio: 4.5 Ratio
Triglycerides: 112 mg/dL (ref <150)
VLDL: 22 mg/dL (ref 0–40)

## 2013-03-18 LAB — COMPREHENSIVE METABOLIC PANEL
ALT: 13 U/L (ref 0–35)
BUN: 10 mg/dL (ref 6–23)
CO2: 23 mEq/L (ref 19–32)
Calcium: 9.5 mg/dL (ref 8.4–10.5)
Chloride: 107 mEq/L (ref 96–112)
Creat: 0.77 mg/dL (ref 0.50–1.10)
Glucose, Bld: 102 mg/dL — ABNORMAL HIGH (ref 70–99)
Total Bilirubin: 0.3 mg/dL (ref 0.3–1.2)

## 2013-03-18 MED ORDER — CLONAZEPAM 0.5 MG PO TABS: ORAL_TABLET | ORAL | Status: DC

## 2013-03-18 MED ORDER — FLUTICASONE PROPIONATE 50 MCG/ACT NA SUSP: 2.0000 | Freq: Every day | NASAL | Status: DC

## 2013-03-18 MED ORDER — MELOXICAM 7.5 MG PO TABS: 7.5000 mg | ORAL_TABLET | Freq: Every day | ORAL | Status: DC

## 2013-03-18 NOTE — Progress Notes (Signed)
[facility] [address], [place] [address] [phone]  Date:  03/18/2013   Name:  Melinda Ferrell   DOB:  1961/05/20   MRN:  [id number]  PCP:  Abbe Amsterdam, MD    Chief Complaint: Annual Exam   History of Present Illness:  Melinda Ferrell is a 52 y.o. very pleasant female patient who presents with the following:  Here today for a CPE- she sees Dr. Stefano Gaul for GYN care.  She uses nexplanon for dysmenorrhea- this was just changed out last week.  Dr. Stefano Gaul noted that her recent pap showed ASCUS but her HPV was negative.    Her last labs were performed in May of 2013 She is using sudafed and dayquil for allergies- she notes a lot of sneezing.  She is also taking zyrtec.   She has used flonase in the past and could use some more of this.   She uses clonazepam for sleep and anxiety.   Dr. Stefano Gaul writes for hydrocodone for her fibroid tumors.  She uses this a "couple of times a week." She also uses meloxicam as needed for aches and pains.  She is taking calcium and vit D.    She is fasting today for labs.   Reminded her that a colonoscopy is due when she turns 52 She has a flu shot last fall at [facility], and a Tdap in 2011.    A good friend was murdered about 3 weeks ago, and this is weighing on her mind.  It is more difficult to sleep than usual.  She has not been drinking, and does feel that she has a good support group and is managing her grief ok.  .    She notes that it is difficult to lose weight.  "I know what to do" to lose weight, but it is harder to lose weight.   She would like to defer TSH check until she sees Dr. Stefano Gaul in July for cost issues.    Patient Active Problem List  Diagnosis  . Dysmenorrhea    Past Medical History  Diagnosis Date  . Cancer 2004    breast  . Alcohol abuse     resolved 2012    Past Surgical History  Procedure Laterality Date  . Appendectomy    . Breast surgery      History  Substance Use Topics  .  Smoking status: Current Every Day Smoker -- 0.20 packs/day    Types: Cigarettes  . Smokeless tobacco: Never Used  . Alcohol Use: No    Family History  Problem Relation Age of Onset  . Hypertension Mother   . Gout Mother   . Diabetes Father   . Hyperlipidemia Father   . Hypertension Father     Allergies  Allergen Reactions  . Sulfa Antibiotics     Medication list has been reviewed and updated.  Current Outpatient Prescriptions on File Prior to Visit  Medication Sig Dispense Refill  . cetirizine (ZYRTEC) 10 MG tablet Take 10 mg by mouth daily.      . clonazePAM (KLONOPIN) 0.5 MG tablet Take 1 tablet by mouth daily and 2 at bedtime if needed for anxiety  30 tablet  0  . etonogestrel (IMPLANON) 68 MG IMPL implant Inject 1 each into the skin once.      Marland Kitchen HYDROcodone-acetaminophen (VICODIN) 5-500 MG per tablet Take 1-2 tablets by mouth every 4 (four) hours as needed for pain.  50 tablet  2  .  ibuprofen (ADVIL,MOTRIN) 800 MG tablet Take 800 mg by mouth every 8 (eight) hours as needed.      . meloxicam (MOBIC) 7.5 MG tablet Take 1-2 tablets (7.5-15 mg total) by mouth daily.  60 tablet  5  . HYDROcodone-acetaminophen (VICODIN) 5-500 MG per tablet Take 1 tablet by mouth every 6 (six) hours as needed.      Marland Kitchen ibuprofen (ADVIL,MOTRIN) 800 MG tablet Take 1 tablet (800 mg total) by mouth every 8 (eight) hours as needed for pain.  50 tablet  2   No current facility-administered medications on file prior to visit.    Review of Systems:  As per HPI- otherwise negative.   Physical Examination: Filed Vitals:   03/18/13 0834  BP: 104/70  Pulse: 87  Temp: 98.1 F (36.7 C)  Resp: 16   Filed Vitals:   03/18/13 0834  Height: 5\' 2"  (1.575 m)  Weight: 177 lb (80.287 kg)   Body mass index is 32.37 kg/(m^2). Ideal Body Weight: Weight in (lb) to have BMI = 25: 136.4  GEN: WDWN, NAD, Non-toxic, A & O x 3, overweight   HEENT: Atraumatic, Normocephalic. Neck supple. No masses, No LAD.   Bilateral TM wnl, oropharynx normal.  PEERL,EOMI.   Ears and Nose: No external deformity. CV: RRR, No M/G/R. No JVD. No thrill. No extra heart sounds. PULM: CTA B, no wheezes, crackles, rhonchi. No retractions. No resp. distress. No accessory muscle use. ABD: S, NT, ND, +BS. No rebound. No HSM. EXTR: No c/c/e NEURO Normal gait.  PSYCH: Normally interactive. Conversant. Not depressed or anxious appearing.  Calm demeanor.   Results for orders placed in visit on 03/18/13  COMPREHENSIVE METABOLIC PANEL      Result Value Range   Sodium 136  135 - 145 mEq/L   Potassium 4.4  3.5 - 5.3 mEq/L   Chloride 107  96 - 112 mEq/L   CO2 23  19 - 32 mEq/L   Glucose, Bld 102 (*) 70 - 99 mg/dL   BUN 10  6 - 23 mg/dL   Creat 1.61  0.96 - 0.45 mg/dL   Total Bilirubin 0.3  0.3 - 1.2 mg/dL   Alkaline Phosphatase 68  39 - 117 U/L   AST 17  0 - 37 U/L   ALT 13  0 - 35 U/L   Total Protein 7.1  6.0 - 8.3 g/dL   Albumin 4.0  3.5 - 5.2 g/dL   Calcium 9.5  8.4 - 40.9 mg/dL  LIPID PANEL      Result Value Range   Cholesterol 204 (*) 0 - 200 mg/dL   Triglycerides 811  <914 mg/dL   HDL 45  >78 mg/dL   Total CHOL/HDL Ratio 4.5     VLDL 22  0 - 40 mg/dL   LDL Cholesterol 295 (*) 0 - 99 mg/dL  POCT CBC      Result Value Range   WBC 4.6  4.6 - 10.2 K/uL   Lymph, poc 2.1  0.6 - 3.4   POC LYMPH PERCENT 45.8  10 - 50 %L   MID (cbc) 0.5  0 - 0.9   POC MID % 10.1  0 - 12 %M   POC Granulocyte 2.0  2 - 6.9   Granulocyte percent 44.1  37 - 80 %G   RBC 3.94 (*) 4.04 - 5.48 M/uL   Hemoglobin 10.8 (*) 12.2 - 16.2 g/dL   HCT, POC 62.1 (*) 30.8 - 47.9 %   MCV 91.6  80 - 97 fL   MCH, POC 27.4  27 - 31.2 pg   MCHC 29.9 (*) 31.8 - 35.4 g/dL   RDW, POC 96.0     Platelet Count, POC 277  142 - 424 K/uL   MPV 7.6  0 - 99.8 fL     Assessment and Plan: Dysmenorrhea - Plan: meloxicam (MOBIC) 7.5 MG tablet  Adjustment disorder with mixed anxiety and depressed mood - Plan: clonazePAM (KLONOPIN) 0.5 MG tablet  Allergic  rhinitis - Plan: fluticasone (FLONASE) 50 MCG/ACT nasal spray  Insomnia - Plan: clonazePAM (KLONOPIN) 0.5 MG tablet  Physical exam, annual - Plan: POCT CBC, Comprehensive metabolic panel, Lipid panel  Refilled her clonazepam.  She takes one during the day, and two before bed.  She has been stable on this dose for some time.  Cautioned her not to combine the clonazepam and hydrocodone due to risk of excess sedation.    Will plan further follow- up pending labs. See patient instructions for more details.    Meds ordered this encounter  Medications  . meloxicam (MOBIC) 7.5 MG tablet    Sig: Take 1-2 tablets (7.5-15 mg total) by mouth daily.    Dispense:  60 tablet    Refill:  5  . fluticasone (FLONASE) 50 MCG/ACT nasal spray    Sig: Place 2 sprays into the nose daily.    Dispense:  16 g    Refill:  6  . clonazePAM (KLONOPIN) 0.5 MG tablet    Sig: Take 1 tablet by mouth daily and 2 at bedtime if needed for anxiety    Dispense:  90 tablet    Refill:  5    Signed Abbe Amsterdam, MD

## 2013-03-18 NOTE — Patient Instructions (Addendum)
I will be in touch with your labs.  Remember that a colonoscopy is due when you turn 52.  This can be done by any GI doctor in town, just call and ask to schedule a screening colonoscopy.   Remember to get a TSH when you see Dr. Stefano Gaul next.

## 2013-03-19 ENCOUNTER — Encounter: Payer: Self-pay | Admitting: Family Medicine

## 2013-03-19 NOTE — Addendum Note (Signed)
Addended by: Abbe Amsterdam C on: 03/19/2013 01:22 PM   Modules accepted: Orders

## 2013-03-25 ENCOUNTER — Telehealth: Payer: Self-pay

## 2013-03-25 ENCOUNTER — Other Ambulatory Visit: Payer: Self-pay | Admitting: Family Medicine

## 2013-03-25 NOTE — Telephone Encounter (Signed)
Received RF request for her klonopin- however, I gave her an rx with 5RF at our visit last week.  Per the pharmacy she has not brought this in yet.  Looking back at rx from 4/14, it looks like it was written as a phone in rx, in error.  [facility]ed [facility] and [facility] from 4/14, #90 with 5 RF>

## 2013-03-25 NOTE — Telephone Encounter (Signed)
[facility] [address] tells pt that her rx for klonopin was not received. They received other rx's sent, but not that one. Pt states when she looks at [employer] she sees the rx and when i looked under her medications i verified with the pt the correct pharmacy and saw the rx was sent correctly.  Pt asks to please resend.   bf

## 2013-03-25 NOTE — Telephone Encounter (Signed)
This one would have been written, did she get it with her other papers? Called her, she will find papers and see if it is in with them, Melinda Ferrell

## 2013-03-27 ENCOUNTER — Telehealth: Payer: Self-pay

## 2013-03-27 NOTE — Telephone Encounter (Signed)
Pt is calling to let Dr. Patsy Lager know that she has scheduled herself an apt with Dr. Barrie Dunker.

## 2013-07-01 ENCOUNTER — Ambulatory Visit
Admission: RE | Admit: 2013-07-01 | Discharge: 2013-07-01 | Disposition: A | Discharge status: Home / Self Care | Payer: 59 | Source: Ambulatory Visit

## 2013-07-01 ENCOUNTER — Ambulatory Visit: Payer: 59

## 2013-07-01 DIAGNOSIS — Z1231 Encounter for screening mammogram for malignant neoplasm of breast: Secondary | ICD-10-CM

## 2013-07-02 ENCOUNTER — Other Ambulatory Visit: Payer: Self-pay | Admitting: Obstetrics and Gynecology

## 2013-07-02 DIAGNOSIS — R928 Other abnormal and inconclusive findings on diagnostic imaging of breast: Secondary | ICD-10-CM

## 2013-07-19 ENCOUNTER — Ambulatory Visit
Admission: RE | Admit: 2013-07-19 | Discharge: 2013-07-19 | Disposition: A | Discharge status: Home / Self Care | Payer: 59 | Source: Ambulatory Visit | Attending: Obstetrics and Gynecology | Admitting: Obstetrics and Gynecology

## 2013-07-19 DIAGNOSIS — R928 Other abnormal and inconclusive findings on diagnostic imaging of breast: Secondary | ICD-10-CM

## 2013-07-22 ENCOUNTER — Other Ambulatory Visit: Payer: Self-pay | Admitting: Family Medicine

## 2013-07-22 DIAGNOSIS — N6310 Unspecified lump in the right breast, unspecified quadrant: Secondary | ICD-10-CM

## 2013-10-09 ENCOUNTER — Other Ambulatory Visit: Payer: Self-pay | Admitting: Family Medicine

## 2013-10-09 DIAGNOSIS — F4323 Adjustment disorder with mixed anxiety and depressed mood: Secondary | ICD-10-CM

## 2013-10-10 ENCOUNTER — Other Ambulatory Visit: Payer: Self-pay

## 2013-10-10 NOTE — Telephone Encounter (Signed)
Dr Patsy Lager, do you want to RF or pt need to RTC?

## 2013-10-10 NOTE — Telephone Encounter (Signed)
I will refill 30 pills for her- otherwise she needs to be seen. Called and let her know.  She will come and see me soon

## 2013-10-20 ENCOUNTER — Other Ambulatory Visit: Payer: Self-pay | Admitting: Family Medicine

## 2013-10-20 DIAGNOSIS — F411 Generalized anxiety disorder: Secondary | ICD-10-CM

## 2013-10-21 NOTE — Telephone Encounter (Signed)
Called pharmacy.  Last fill #90 on 08/27/13.  Ok to refill but she needs to be seen for a recheck- sent a mychart message to her

## 2013-10-28 ENCOUNTER — Ambulatory Visit (INDEPENDENT_AMBULATORY_CARE_PROVIDER_SITE_OTHER): Payer: 59 | Admitting: Family Medicine

## 2013-10-28 ENCOUNTER — Ambulatory Visit: Payer: 59

## 2013-10-28 VITALS — BP 92/68 | HR 72 | Temp 98.8°F | Resp 16 | Ht 63.0 in | Wt 184.0 lb

## 2013-10-28 DIAGNOSIS — F411 Generalized anxiety disorder: Secondary | ICD-10-CM

## 2013-10-28 DIAGNOSIS — D649 Anemia, unspecified: Secondary | ICD-10-CM

## 2013-10-28 DIAGNOSIS — N946 Dysmenorrhea, unspecified: Secondary | ICD-10-CM

## 2013-10-28 DIAGNOSIS — D72819 Decreased white blood cell count, unspecified: Secondary | ICD-10-CM

## 2013-10-28 DIAGNOSIS — G4700 Insomnia, unspecified: Secondary | ICD-10-CM

## 2013-10-28 DIAGNOSIS — M79642 Pain in left hand: Secondary | ICD-10-CM

## 2013-10-28 DIAGNOSIS — M79609 Pain in unspecified limb: Secondary | ICD-10-CM

## 2013-10-28 LAB — POCT CBC
Hemoglobin: 12.1 g/dL — AB (ref 12.2–16.2)
Lymph, poc: 1.9 (ref 0.6–3.4)
MCH, POC: 28.5 pg (ref 27–31.2)
MCHC: 30.5 g/dL — AB (ref 31.8–35.4)
MID (cbc): 0.3 (ref 0–0.9)
MPV: 7.9 fL (ref 0–99.8)
POC Granulocyte: 1.4 — AB (ref 2–6.9)
POC LYMPH PERCENT: 52.6 %L — AB (ref 10–50)
POC MID %: 8.8 %M (ref 0–12)
Platelet Count, POC: 275 10*3/uL (ref 142–424)
RDW, POC: 12.9 %
WBC: 3.7 10*3/uL — AB (ref 4.6–10.2)

## 2013-10-28 MED ORDER — HYDROCODONE-ACETAMINOPHEN 5-500 MG PO TABS: 1.0000 | ORAL_TABLET | Freq: Four times a day (QID) | ORAL | Status: DC | PRN

## 2013-10-28 MED ORDER — CLONAZEPAM 0.5 MG PO TABS: ORAL_TABLET | ORAL | Status: DC

## 2013-10-28 MED ORDER — MELOXICAM 7.5 MG PO TABS: 7.5000 mg | ORAL_TABLET | Freq: Every day | ORAL | Status: DC

## 2013-10-28 NOTE — Patient Instructions (Signed)
Try wearing the splint on your left wrist at night for one or two weeks.   Continue to use the hydrocodone ONLY for severe pain.  Remember that this medication is now schedule II and is more restricted by the DEA  You can use the klonopin as needed; remember to use this during the day ONLY when necessary as it can be habit forming

## 2013-10-28 NOTE — Progress Notes (Signed)
[facility] [address] [place] [address] [phone]  Date:  10/28/2013   Name:  Melinda Ferrell   DOB:  Jun 22, 1961   MRN:  [id number]  PCP:  Abbe Amsterdam, MD    Chief Complaint: Follow-up, Hand Pain, Pneumonia Vaccination and Medication Refill   History of Present Illness:  Melinda Ferrell is a 52 y.o. very pleasant female patient who presents with the following:  Here today for a recheck.  She has not been seen since April.  Breast cancer about 10 years ago; in remission. No current alcohol abuse.   Not smoking anymore.   She had a flu shot already this year.    She had a TSH earlier this year per her GYN- it was ok  She has noted a problem with her left hand.  It will come and go. She feels pain over the 4th and 5th MCs at times. She cannot see any particular pattern of her pain.  She has noted it for 3 months.    She works at the [employer].  She will sometimes note pain if she lifts a heavy tray, but otherwise there is no clear relationship to working or lifting.  No numbness, no tingling,  But she will sometimes feel that the hand is not as strong.  Not worse in the am.    She is taking vicodin prn for pelvic pain.  She does not get menses now that she has nexplanon.  Her pain is not as bad now that she has nexplanon.  She had this changed out in April of this year.   She would estimate that she is taking 10 vicodin in a month.   She also used clonzepam for anxiety.  She is taking 0.5 TID daily. She has been doing this for a couple of years.  She notes that she is sleeping better through the night.  She takes 1 in the day and 2 hs.    She had full labs done per Dr. Stefano Gaul in August.    She does "not feel depressed at all."  However "at night I just have a hard time getting my mind to slow down so I can go to sleep."  Work can be stressful and cause her trouble with sleep.   She does use mobic as well prn for her pelvic pain  Noted to have  anemia the last time whe checked her blood count in April.  Recheck this today  Patient Active Problem List   Diagnosis Date Noted  . Dysmenorrhea 11/19/2012    Past Medical History  Diagnosis Date  . Cancer 2004    breast  . Alcohol abuse     resolved 2012    Past Surgical History  Procedure Laterality Date  . Appendectomy    . Breast surgery      History  Substance Use Topics  . Smoking status: Current Every Day Smoker -- 0.20 packs/day    Types: Cigarettes  . Smokeless tobacco: Never Used  . Alcohol Use: No    Family History  Problem Relation Age of Onset  . Hypertension Mother   . Gout Mother   . Diabetes Father   . Hyperlipidemia Father   . Hypertension Father     Allergies  Allergen Reactions  . Sulfa Antibiotics     Medication list has been reviewed and updated.  Current Outpatient Prescriptions on File Prior to Visit  Medication Sig Dispense Refill  . clonazePAM (  KLONOPIN) 0.5 MG tablet TAKE 1 TABLET BY MOUTH EVERY DAY AND 2 AT BEDTIME AS NEEDED FOR ANXIETY  90 tablet  0  . etonogestrel (IMPLANON) 68 MG IMPL implant Inject 1 each into the skin once.      Marland Kitchen ibuprofen (ADVIL,MOTRIN) 800 MG tablet Take 800 mg by mouth every 8 (eight) hours as needed.      Marland Kitchen ibuprofen (ADVIL,MOTRIN) 800 MG tablet Take 1 tablet (800 mg total) by mouth every 8 (eight) hours as needed for pain.  50 tablet  2  . meloxicam (MOBIC) 7.5 MG tablet Take 1-2 tablets (7.5-15 mg total) by mouth daily.  60 tablet  5  . cetirizine (ZYRTEC) 10 MG tablet Take 10 mg by mouth daily.      . fluticasone (FLONASE) 50 MCG/ACT nasal spray Place 2 sprays into the nose daily.  16 g  6  . HYDROcodone-acetaminophen (VICODIN) 5-500 MG per tablet Take 1 tablet by mouth every 6 (six) hours as needed.      Marland Kitchen HYDROcodone-acetaminophen (VICODIN) 5-500 MG per tablet Take 1-2 tablets by mouth every 4 (four) hours as needed for pain.  50 tablet  2   No current facility-administered medications on file prior  to visit.    Review of Systems:  As per HPI- otherwise negative.   Physical Examination: Filed Vitals:   10/28/13 0825  BP: 92/60  Pulse: 72  Temp: 98.8 F (37.1 C)  Resp: 16   Filed Vitals:   10/28/13 0825  Height: 5\' 3"  (1.6 m)  Weight: 184 lb (83.462 kg)   Body mass index is 32.6 kg/(m^2). Ideal Body Weight: Weight in (lb) to have BMI = 25: 140.8  GEN: WDWN, NAD, Non-toxic, A & O x 3, overweight, looks well HEENT: Atraumatic, Normocephalic. Neck supple. No masses, No LAD. Ears and Nose: No external deformity. CV: RRR, No M/G/R. No JVD. No thrill. No extra heart sounds. PULM: CTA B, no wheezes, crackles, rhonchi. No retractions. No resp. distress. No accessory muscle use. ABD: S, NT, ND, +BS. No rebound. No HSM. EXTR: No c/c/e NEURO Normal gait.  PSYCH: Normally interactive. Conversant. Not depressed or anxious appearing.  Calm demeanor.  Left hand: no swelling, redness, heat or lesion.  No reproducible tenderness over the lateral left hand, but she does note discomfort with flexion of the wrist.  Hand with normal strength and perfusioins  [facility] reading (PRIMARY) by  Dr. Patsy Lager. Left hand: negative, no significant degenerative change  LEFT HAND - 2 VIEW  COMPARISON: None.  FINDINGS: No fracture or bone lesion. The joints are normally space and aligned. The soft tissues are unremarkable.  IMPRESSION: Negative.  Results for orders placed in visit on 10/28/13  POCT CBC      Result Value Range   WBC 3.7 (*) 4.6 - 10.2 K/uL   Lymph, poc 1.9  0.6 - 3.4   POC LYMPH PERCENT 52.6 (*) 10 - 50 %L   MID (cbc) 0.3  0 - 0.9   POC MID % 8.8  0 - 12 %M   POC Granulocyte 1.4 (*) 2 - 6.9   Granulocyte percent 38.6  37 - 80 %G   RBC 4.24  4.04 - 5.48 M/uL   Hemoglobin 12.1 (*) 12.2 - 16.2 g/dL   HCT, POC 16.1  09.6 - 47.9 %   MCV 93.6  80 - 97 fL   MCH, POC 28.5  27 - 31.2 pg   MCHC 30.5 (*) 31.8 - 35.4 g/dL  RDW, POC 12.9     Platelet Count, POC 275  142 - 424 K/uL    MPV 7.9  0 - 99.8 fL    Assessment and Plan: Insomnia  Dysmenorrhea - Plan: meloxicam (MOBIC) 7.5 MG tablet, HYDROcodone-acetaminophen (VICODIN) 5-500 MG per tablet  Left hand pain - Plan: POCT CBC  Anemia - Plan: DG Hand 2 View Left  Anxiety state, unspecified - Plan: clonazePAM (KLONOPIN) 0.5 MG tablet  Leukopenia - Plan: POCT CBC  Refilled her vicodin to use as needed for pelvic pain. Explained to her that this is now schedule 2 and I cannot give RF or call in.  Will give #60 which should last for several months Also refilled her mobic  Left hand pain: no apparent degenerative change or injury.  Will try a wrist splint at night for about 2 weeks; let me know if not better Anemia: now resolved, but she has leukopenia now.  Recheck CBC as a lab visit only in 2 months.  She is aware that she needs to come in for this Refilled konopin for prn use  Meds ordered this encounter  Medications  . meloxicam (MOBIC) 7.5 MG tablet    Sig: Take 1-2 tablets (7.5-15 mg total) by mouth daily.    Dispense:  60 tablet    Refill:  5  . clonazePAM (KLONOPIN) 0.5 MG tablet    Sig: Take one by mouth qday and 2 qhs prn anxiety and insomnia    Dispense:  90 tablet    Refill:  4    Ok to fill first rx on 11/19/2013  . HYDROcodone-acetaminophen (VICODIN) 5-500 MG per tablet    Sig: Take 1-2 tablets by mouth every 6 (six) hours as needed for pain.    Dispense:  60 tablet    Refill:  0     Signed Abbe Amsterdam, MD

## 2013-10-30 ENCOUNTER — Telehealth: Payer: Self-pay | Admitting: *Deleted

## 2013-10-30 MED ORDER — HYDROCODONE-ACETAMINOPHEN 5-325 MG PO TABS: 1.0000 | ORAL_TABLET | Freq: Four times a day (QID) | ORAL | Status: DC | PRN

## 2013-10-30 NOTE — Telephone Encounter (Signed)
Rx printed for 5/325.  Signed at TL desk for pt to pick up  To you FYI Dr Patsy Lager

## 2013-10-30 NOTE — Telephone Encounter (Signed)
Patient advised ready for pick up

## 2013-10-30 NOTE — Telephone Encounter (Signed)
[facility] and stated that they could not change the strength on hydrocodone 5/500.  So pt needs a new script for hydrocodone 5/325.

## 2014-01-14 ENCOUNTER — Ambulatory Visit
Admission: RE | Admit: 2014-01-14 | Discharge: 2014-01-14 | Disposition: A | Discharge status: Home / Self Care | Payer: 59 | Source: Ambulatory Visit | Attending: Family Medicine | Admitting: Family Medicine

## 2014-01-14 ENCOUNTER — Other Ambulatory Visit: Payer: Self-pay | Admitting: Family Medicine

## 2014-01-14 DIAGNOSIS — N6310 Unspecified lump in the right breast, unspecified quadrant: Secondary | ICD-10-CM

## 2014-01-25 ENCOUNTER — Other Ambulatory Visit: Payer: Self-pay | Admitting: Physician Assistant

## 2014-01-27 ENCOUNTER — Telehealth: Payer: Self-pay

## 2014-01-27 ENCOUNTER — Other Ambulatory Visit: Payer: Self-pay | Admitting: Physician Assistant

## 2014-01-27 NOTE — Telephone Encounter (Signed)
Pt is calling to request a refill on hydrocodone

## 2014-01-28 NOTE — Telephone Encounter (Signed)
Fwd to Dr. Lorelei Pont as she is the prescriber.

## 2014-01-28 NOTE — Telephone Encounter (Signed)
Called her back- I gave her #60 vicodin at the end of November- at that time my understanding was that she took this on occasion for pelvic pain, about 10 per month.  However today she states that she is using it instead of the mobic for back pain, and this is why she has used it all up already. Explained to her that this was not my understanding, and that if she has back pain requiring narcotic pain control we need to look at this to make sure nothing else is wrong.  She agreed to come and see me prior to further refills.  She will use mobic for her back pain in the meantime.

## 2014-02-02 ENCOUNTER — Telehealth: Payer: Self-pay | Admitting: Family Medicine

## 2014-02-02 NOTE — Telephone Encounter (Signed)
Pulled controlled substance report- she is filling her klonopin approx once a month from our office only.    I gave her #60 vicodin on 11/24, and she then got another #60 on 12/22.  At our last visit in November she told me that she is taking a vicodin maybe 10x a month.

## 2014-04-07 ENCOUNTER — Other Ambulatory Visit: Payer: Self-pay | Admitting: Family Medicine

## 2014-04-07 DIAGNOSIS — N6310 Unspecified lump in the right breast, unspecified quadrant: Secondary | ICD-10-CM

## 2014-04-20 ENCOUNTER — Ambulatory Visit (INDEPENDENT_AMBULATORY_CARE_PROVIDER_SITE_OTHER): Payer: 59 | Admitting: Family Medicine

## 2014-04-20 VITALS — BP 120/82 | HR 79 | Temp 98.2°F | Resp 14 | Ht 61.0 in | Wt 178.2 lb

## 2014-04-20 DIAGNOSIS — N946 Dysmenorrhea, unspecified: Secondary | ICD-10-CM

## 2014-04-20 DIAGNOSIS — Z0000 Encounter for general adult medical examination without abnormal findings: Secondary | ICD-10-CM

## 2014-04-20 DIAGNOSIS — M549 Dorsalgia, unspecified: Secondary | ICD-10-CM

## 2014-04-20 DIAGNOSIS — Z13220 Encounter for screening for lipoid disorders: Secondary | ICD-10-CM

## 2014-04-20 DIAGNOSIS — F411 Generalized anxiety disorder: Secondary | ICD-10-CM

## 2014-04-20 LAB — LIPID PANEL
CHOLESTEROL: 188 mg/dL (ref 0–200)
HDL: 42 mg/dL (ref >39)
LDL Cholesterol: 129 mg/dL — ABNORMAL HIGH (ref 0–99)
TRIGLYCERIDES: 85 mg/dL (ref <150)
Total CHOL/HDL Ratio: 4.5 Ratio
VLDL: 17 mg/dL (ref 0–40)

## 2014-04-20 LAB — COMPREHENSIVE METABOLIC PANEL
ALT: 11 U/L (ref 0–35)
AST: 15 U/L (ref 0–37)
Albumin: 3.9 g/dL (ref 3.5–5.2)
Alkaline Phosphatase: 64 U/L (ref 39–117)
BILIRUBIN TOTAL: 0.5 mg/dL (ref 0.2–1.2)
BUN: 9 mg/dL (ref 6–23)
CALCIUM: 9.6 mg/dL (ref 8.4–10.5)
CHLORIDE: 105 meq/L (ref 96–112)
CO2: 25 meq/L (ref 19–32)
CREATININE: 0.92 mg/dL (ref 0.50–1.10)
GLUCOSE: 85 mg/dL (ref 70–99)
Potassium: 4.3 mEq/L (ref 3.5–5.3)
SODIUM: 139 meq/L (ref 135–145)
TOTAL PROTEIN: 7 g/dL (ref 6.0–8.3)

## 2014-04-20 LAB — CBC
HCT: 38.3 % (ref 36.0–46.0)
HEMOGLOBIN: 12.8 g/dL (ref 12.0–15.0)
MCH: 29 pg (ref 26.0–34.0)
MCHC: 33.4 g/dL (ref 30.0–36.0)
MCV: 86.8 fL (ref 78.0–100.0)
Platelets: 322 10*3/uL (ref 150–400)
RBC: 4.41 MIL/uL (ref 3.87–5.11)
RDW: 13.5 % (ref 11.5–15.5)
WBC: 5 10*3/uL (ref 4.0–10.5)

## 2014-04-20 MED ORDER — HYDROCODONE-ACETAMINOPHEN 5-325 MG PO TABS: 1.0000 | ORAL_TABLET | Freq: Four times a day (QID) | ORAL | Status: DC | PRN

## 2014-04-20 MED ORDER — CLONAZEPAM 0.5 MG PO TABS: ORAL_TABLET | ORAL | Status: DC

## 2014-04-20 MED ORDER — MELOXICAM 7.5 MG PO TABS: 7.5000 mg | ORAL_TABLET | Freq: Every day | ORAL | Status: DC

## 2014-04-20 NOTE — Progress Notes (Signed)
[facility] [address] [place] [address] [phone]  Date:  04/20/2014   Name:  Melinda Ferrell   DOB:  August 16, 1961   MRN:  [id number]  PCP:  Lamar Blinks, MD    Chief Complaint: Annual Exam   History of Present Illness:  Melinda Ferrell is a 53 y.o. very pleasant female patient who presents with the following:  Here today for annual physical exam.   She is doing well, no particular complaints today.  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  She is fasting this am  She sees Dr. Raphael Gibney for her women's health care and he also checks her TSH for her.   Dr. Raphael Gibney does her pap and mammogram. Her mammo is scheduled for this August.   She did her colonosocpy last June at 53 years old- she is good for 5 years per her report  She is smoking a few cigarettes a day, she does walk 30 minutes once a day,  Would like to stop smoking completly  She had a tdap in 2011.    She continues to use klonpin 0.5 during the day and 1 mg at night  She uses meloxicam as needed for her back pain.  She takes it approx 3x a week She does use hydrocodone on occasion as needed for back pain.  May take twice a week  Patient Active Problem List   Diagnosis Date Noted  . Dysmenorrhea 11/19/2012    Past Medical History  Diagnosis Date  . Cancer 2004    breast  . Alcohol abuse     resolved 2012  . Allergy   . Clotting disorder     Past Surgical History  Procedure Laterality Date  . Appendectomy    . Breast surgery      History  Substance Use Topics  . Smoking status: Current Every Day Smoker -- 0.20 packs/day    Types: Cigarettes  . Smokeless tobacco: Never Used  . Alcohol Use: No    Family History  Problem Relation Age of Onset  . Hypertension Mother   . Gout Mother   . Diabetes Father   . Hyperlipidemia Father   . Hypertension Father     Allergies  Allergen Reactions  . Sulfa Antibiotics     Medication list has been reviewed and  updated.  Current Outpatient Prescriptions on File Prior to Visit  Medication Sig Dispense Refill  . cetirizine (ZYRTEC) 10 MG tablet Take 10 mg by mouth daily.      . clonazePAM (KLONOPIN) 0.5 MG tablet Take one by mouth qday and 2 qhs prn anxiety and insomnia  90 tablet  4  . etonogestrel (IMPLANON) 68 MG IMPL implant Inject 1 each into the skin once.      . fluticasone (FLONASE) 50 MCG/ACT nasal spray Place 2 sprays into the nose daily.  16 g  6  . HYDROcodone-acetaminophen (NORCO) 5-325 MG per tablet Take 1 tablet by mouth every 6 (six) hours as needed.  60 tablet  0  . ibuprofen (ADVIL,MOTRIN) 800 MG tablet Take 1 tablet (800 mg total) by mouth every 8 (eight) hours as needed for pain.  50 tablet  2  . meloxicam (MOBIC) 7.5 MG tablet Take 1-2 tablets (7.5-15 mg total) by mouth daily.  60 tablet  5   No current facility-administered medications on file prior to visit.    Review of Systems:  As per HPI- otherwise negative.   Physical Examination: Danley Danker  Vitals:   04/20/14 1012  BP: 120/82  Pulse: 79  Temp: 98.2 F (36.8 C)  Resp: 14   Filed Vitals:   04/20/14 1012  Height: 5\' 1"  (1.549 m)  Weight: 178 lb 3.2 oz (80.831 kg)   Body mass index is 33.69 kg/(m^2). Ideal Body Weight: Weight in (lb) to have BMI = 25: 132  GEN: WDWN, NAD, Non-toxic, A & O x 3, overweight, looks well HEENT: Atraumatic, Normocephalic. Neck supple. No masses, No LAD.  Bilateral TM wnl, oropharynx normal.  PEERL,EOMI.   Ears and Nose: No external deformity. CV: RRR, No M/G/R. No JVD. No thrill. No extra heart sounds. PULM: CTA B, no wheezes, crackles, rhonchi. No retractions. No resp. distress. No accessory muscle use. ABD: S, NT, ND. No rebound. No HSM. EXTR: No c/c/e NEURO Normal gait.  PSYCH: Normally interactive. Conversant. Not depressed or anxious appearing.  Calm demeanor.    Assessment and Plan: Physical exam - Plan: CBC, Comprehensive metabolic panel, Lipid panel  Screening for  hyperlipidemia - Plan: Lipid panel  Anxiety state, unspecified - Plan: clonazePAM (KLONOPIN) 0.5 MG tablet  Dysmenorrhea - Plan: meloxicam (MOBIC) 7.5 MG tablet  Back pain - Plan: meloxicam (MOBIC) 7.5 MG tablet, HYDROcodone-acetaminophen (NORCO) 5-325 MG per tablet  CPE for ae as above.  Immunizations and screening are UTD Refilled her klonopin for 6 months.  Refilled her mobic for her dysmenorrhea and back pain.  Did give her a few norco (15) but advised her that these are only for unusually severe pain, and otherwise I would like her to use the mobic preferentially  See patient instructions for more details.   Meds ordered this encounter  Medications  . clonazePAM (KLONOPIN) 0.5 MG tablet    Sig: Take one by mouth qday and 2 qhs prn anxiety and insomnia    Dispense:  90 tablet    Refill:  5  . meloxicam (MOBIC) 7.5 MG tablet    Sig: Take 1-2 tablets (7.5-15 mg total) by mouth daily. As needed for back pain    Dispense:  60 tablet    Refill:  5  . HYDROcodone-acetaminophen (NORCO) 5-325 MG per tablet    Sig: Take 1 tablet by mouth every 6 (six) hours as needed.    Dispense:  15 tablet    Refill:  0    Order Specific Question:  Supervising Provider    Answer:  Wardell Honour [[id number]]     Signed Lamar Blinks, MD

## 2014-04-20 NOTE — Patient Instructions (Signed)
Good to see you today- I will be in touch with your labs.  I will send them to you over mychart.  Please continue to work on quitting smoking, and try to increase your exercise to about 45 minutes a day

## 2014-07-16 ENCOUNTER — Other Ambulatory Visit: Payer: Self-pay | Admitting: Family Medicine

## 2014-07-16 ENCOUNTER — Other Ambulatory Visit: Payer: Self-pay

## 2014-07-16 DIAGNOSIS — N6310 Unspecified lump in the right breast, unspecified quadrant: Secondary | ICD-10-CM

## 2014-07-22 ENCOUNTER — Ambulatory Visit
Admission: RE | Admit: 2014-07-22 | Discharge: 2014-07-22 | Disposition: A | Discharge status: Home / Self Care | Payer: 59 | Source: Ambulatory Visit | Attending: Family Medicine | Admitting: Family Medicine

## 2014-07-22 ENCOUNTER — Other Ambulatory Visit: Payer: Self-pay | Admitting: Family Medicine

## 2014-07-22 DIAGNOSIS — N6310 Unspecified lump in the right breast, unspecified quadrant: Secondary | ICD-10-CM

## 2014-10-20 ENCOUNTER — Encounter: Payer: Self-pay | Admitting: Family Medicine

## 2014-10-22 ENCOUNTER — Other Ambulatory Visit: Payer: Self-pay

## 2014-10-22 ENCOUNTER — Telehealth: Payer: Self-pay | Admitting: Family Medicine

## 2014-10-22 DIAGNOSIS — N946 Dysmenorrhea, unspecified: Secondary | ICD-10-CM

## 2014-10-22 DIAGNOSIS — F411 Generalized anxiety disorder: Secondary | ICD-10-CM

## 2014-10-22 NOTE — Telephone Encounter (Signed)
[facility] sent reqs for Rxs for clonazepam and meloxicam. Pended for review, see email from pt 10/20/14. Pt was last seen in May, do you want to approve 3 mos RFs?

## 2014-10-22 NOTE — Telephone Encounter (Signed)
Pended refills and sent to Dr. Lorelei Pont.

## 2014-10-22 NOTE — Telephone Encounter (Signed)
Patient sent the following message via MyChart:  Good afternoon Dr. Lorelei Pont,  I am switching to mail order instead of the pharmacy. You will be receiving a fax for my two medications. My medications have tremendously increased. I have one refill left on my clonazepam and 5 refills left on my meloxicam. I cancelled my refills at the pharmacy. Please approve the mail order for me.  Thank you,  Contact me on [phone] if there are any questions.

## 2014-10-23 ENCOUNTER — Encounter: Payer: Self-pay | Admitting: Family Medicine

## 2014-10-23 MED ORDER — MELOXICAM 7.5 MG PO TABS: 7.5000 mg | ORAL_TABLET | Freq: Every day | ORAL | Status: AC

## 2014-10-23 MED ORDER — CLONAZEPAM 0.5 MG PO TABS: ORAL_TABLET | ORAL | Status: DC

## 2014-10-23 NOTE — Telephone Encounter (Signed)
Dr Lorelei Pont did this - see notes under MyChart message.

## 2014-11-11 ENCOUNTER — Other Ambulatory Visit: Payer: Self-pay | Admitting: Family Medicine

## 2015-05-04 ENCOUNTER — Ambulatory Visit (INDEPENDENT_AMBULATORY_CARE_PROVIDER_SITE_OTHER): Payer: 59 | Admitting: Family Medicine

## 2015-05-04 VITALS — BP 100/70 | HR 89 | Temp 98.6°F | Resp 16 | Ht 62.0 in | Wt 165.4 lb

## 2015-05-04 DIAGNOSIS — Z119 Encounter for screening for infectious and parasitic diseases, unspecified: Secondary | ICD-10-CM

## 2015-05-04 DIAGNOSIS — Z0000 Encounter for general adult medical examination without abnormal findings: Secondary | ICD-10-CM

## 2015-05-04 DIAGNOSIS — Z13220 Encounter for screening for lipoid disorders: Secondary | ICD-10-CM

## 2015-05-04 DIAGNOSIS — Z131 Encounter for screening for diabetes mellitus: Secondary | ICD-10-CM

## 2015-05-04 DIAGNOSIS — F411 Generalized anxiety disorder: Secondary | ICD-10-CM | POA: Diagnosis not present

## 2015-05-04 DIAGNOSIS — G4700 Insomnia, unspecified: Secondary | ICD-10-CM | POA: Diagnosis not present

## 2015-05-04 DIAGNOSIS — Z130 Encounter for screening for diseases of the blood and blood-forming organs and certain disorders involving the immune mechanism: Secondary | ICD-10-CM

## 2015-05-04 LAB — COMPREHENSIVE METABOLIC PANEL
ALK PHOS: 76 U/L (ref 39–117)
ALT: 11 U/L (ref 0–35)
AST: 14 U/L (ref 0–37)
Albumin: 3.8 g/dL (ref 3.5–5.2)
BUN: 15 mg/dL (ref 6–23)
CALCIUM: 9 mg/dL (ref 8.4–10.5)
CHLORIDE: 109 meq/L (ref 96–112)
CO2: 20 mEq/L (ref 19–32)
CREATININE: 0.76 mg/dL (ref 0.50–1.10)
GLUCOSE: 101 mg/dL — AB (ref 70–99)
Potassium: 4.2 mEq/L (ref 3.5–5.3)
Sodium: 139 mEq/L (ref 135–145)
TOTAL PROTEIN: 6.7 g/dL (ref 6.0–8.3)
Total Bilirubin: 0.4 mg/dL (ref 0.2–1.2)

## 2015-05-04 LAB — LIPID PANEL
CHOLESTEROL: 196 mg/dL (ref 0–200)
HDL: 54 mg/dL (ref >=46)
LDL CALC: 131 mg/dL — AB (ref 0–99)
Total CHOL/HDL Ratio: 3.6 Ratio
Triglycerides: 57 mg/dL (ref <150)
VLDL: 11 mg/dL (ref 0–40)

## 2015-05-04 LAB — CBC
HCT: 37.4 % (ref 36.0–46.0)
Hemoglobin: 12.7 g/dL (ref 12.0–15.0)
MCH: 29.6 pg (ref 26.0–34.0)
MCHC: 34 g/dL (ref 30.0–36.0)
MCV: 87.2 fL (ref 78.0–100.0)
MPV: 8.4 fL — ABNORMAL LOW (ref 8.6–12.4)
Platelets: 342 10*3/uL (ref 150–400)
RBC: 4.29 MIL/uL (ref 3.87–5.11)
RDW: 12.5 % (ref 11.5–15.5)
WBC: 4.6 10*3/uL (ref 4.0–10.5)

## 2015-05-04 MED ORDER — TRAZODONE HCL 50 MG PO TABS: 25.0000 mg | ORAL_TABLET | Freq: Every evening | ORAL | Status: DC | PRN

## 2015-05-04 MED ORDER — CLONAZEPAM 0.5 MG PO TABS
0.5000 mg | ORAL_TABLET | Freq: Every day | ORAL | Status: DC
Start: 2015-05-04 — End: 2015-05-29

## 2015-05-04 NOTE — Progress Notes (Signed)
[facility] [address], [place] [address] [phone]  Date:  05/04/2015   Name:  Melinda Ferrell   DOB:  08-Sep-1961   MRN:  [id number]  PCP:  Lamar Blinks, MD    Chief Complaint: CPE   History of Present Illness:  Melinda Ferrell is a 54 y.o. very pleasant female patient who presents with the following:  Here today for a CPE.   Dr. Raphael Gibney is her OBG- he also manages her thyroid checks colonosocpy at age 37 She is fasting today for labs.   She is doing well. She has been losing weight by changing her diet- she is getting more exercise but the main change is her diet.   She continues to stay away from alcohol Her family is well   She is smoking a few cigs a day- some days she does not smoke at all, up to a max of 3-4 per day. She is working on this She does uses some klonopin as needed.  She takes one during the day and 2 at night.   She notes that her sleep is ok some of the time.  She has been out for a couple of weeks.  BP Readings from Last 3 Encounters:  05/04/15 98/76  04/20/14 120/82  10/28/13 92/68    Wt Readings from Last 3 Encounters:  05/04/15 165 lb 6.4 oz (75.025 kg)  04/20/14 178 lb 3.2 oz (80.831 kg)  10/28/13 184 lb (83.462 kg)     Patient Active Problem List   Diagnosis Date Noted  . Dysmenorrhea 11/19/2012    Past Medical History  Diagnosis Date  . Cancer 2004    breast  . Alcohol abuse     resolved 2012  . Allergy   . Clotting disorder     Past Surgical History  Procedure Laterality Date  . Appendectomy    . Breast surgery      History  Substance Use Topics  . Smoking status: Current Every Day Smoker -- 0.20 packs/day    Types: Cigarettes  . Smokeless tobacco: Never Used  . Alcohol Use: No    Family History  Problem Relation Age of Onset  . Hypertension Mother   . Gout Mother   . Diabetes Father   . Hyperlipidemia Father   . Hypertension Father     Allergies  Allergen Reactions  . Sulfa  Antibiotics     Medication list has been reviewed and updated.  Current Outpatient Prescriptions on File Prior to Visit  Medication Sig Dispense Refill  . clonazePAM (KLONOPIN) 0.5 MG tablet Take one by mouth qday and 2 qhs prn anxiety and insomnia 270 tablet 0  . etonogestrel (IMPLANON) 68 MG IMPL implant Inject 1 each into the skin once.    . meloxicam (MOBIC) 7.5 MG tablet Take 1-2 tablets (7.5-15 mg total) by mouth daily. As needed for back pain. 180 tablet 2  . cetirizine (ZYRTEC) 10 MG tablet Take 10 mg by mouth daily.    . fluticasone (FLONASE) 50 MCG/ACT nasal spray Place 2 sprays into the nose daily. (Patient not taking: Reported on 05/04/2015) 16 g 6  . HYDROcodone-acetaminophen (NORCO) 5-325 MG per tablet Take 1 tablet by mouth every 6 (six) hours as needed. (Patient not taking: Reported on 05/04/2015) 15 tablet 0  . ibuprofen (ADVIL,MOTRIN) 800 MG tablet Take 1 tablet (800 mg total) by mouth every 8 (eight) hours as needed for pain. (Patient not taking: Reported on 05/04/2015) 50 tablet  2   No current facility-administered medications on file prior to visit.    Review of Systems:  As per HPI- otherwise negative.   Physical Examination: Filed Vitals:   05/04/15 0813  BP: 98/76  Pulse: 89  Temp: 98.6 F (37 C)  Resp: 16   Filed Vitals:   05/04/15 0813  Height: 5\' 2"  (1.575 m)  Weight: 165 lb 6.4 oz (75.025 kg)   Body mass index is 30.24 kg/(m^2). Ideal Body Weight: Weight in (lb) to have BMI = 25: 136.4  GEN: WDWN, NAD, Non-toxic, A & O x 3, overweight but has lost, looks well HEENT: Atraumatic, Normocephalic. Neck supple. No masses, No LAD.  Bilateral TM wnl, oropharynx normal.  PEERL,EOMI.   Ears and Nose: No external deformity. CV: RRR, No M/G/R. No JVD. No thrill. No extra heart sounds. PULM: CTA B, no wheezes, crackles, rhonchi. No retractions. No resp. distress. No accessory muscle use. ABD: S, NT, ND, +BS. No rebound. No HSM. EXTR: No c/c/e NEURO Normal  gait.  PSYCH: Normally interactive. Conversant. Not depressed or anxious appearing.  Calm demeanor.    Assessment and Plan: Physical exam  Screening for hyperlipidemia - Plan: Lipid panel  Screening for deficiency anemia - Plan: CBC  Screening for diabetes mellitus - Plan: Comprehensive metabolic panel  Screening examination for infectious disease - Plan: HIV antibody  Insomnia - Plan: traZODone (DESYREL) 50 MG tablet  GAD (generalized anxiety disorder) - Plan: clonazePAM (KLONOPIN) 0.5 MG tablet  Will try trazodone for her insomnia. She may also use her klonopin as needed for her anxiety Await her other labs  Encouraged her to try and stop smoking- she is working on this Congratulated her on her progress with weight loss   Signed Lamar Blinks, MD

## 2015-05-04 NOTE — Patient Instructions (Addendum)
Terrific job on your weight loss!  You should be proud of your efforts I will be in touch with your labs I did your one time HIV screening today; we are doing this for all patients For sleep, why don't you try taking trazodone- this is a type of antidepressant that is also frequently used for sleep It should not have the same risk of habit formation as the klonopin. Start with 25 mg of trazodone and increase to 50 mg as needed I did refill your klonopin to use as needed- try to minimize use of this medication and only use when needed

## 2015-05-05 LAB — HIV ANTIBODY (ROUTINE TESTING W REFLEX): HIV 1&2 Ab, 4th Generation: NONREACTIVE

## 2015-05-07 ENCOUNTER — Encounter: Payer: Self-pay | Admitting: Family Medicine

## 2015-05-26 ENCOUNTER — Encounter: Payer: Self-pay | Admitting: Family Medicine

## 2015-05-26 DIAGNOSIS — F411 Generalized anxiety disorder: Secondary | ICD-10-CM

## 2015-05-29 MED ORDER — CLONAZEPAM 0.5 MG PO TABS: ORAL_TABLET | ORAL | Status: DC

## 2015-06-17 ENCOUNTER — Encounter: Payer: Self-pay | Admitting: Family Medicine

## 2015-06-17 DIAGNOSIS — F411 Generalized anxiety disorder: Secondary | ICD-10-CM

## 2015-06-23 MED ORDER — CLONAZEPAM 0.5 MG PO TABS: ORAL_TABLET | ORAL | Status: DC

## 2015-08-10 LAB — HM PAP SMEAR: HM Pap smear: NORMAL

## 2015-08-10 LAB — HM MAMMOGRAPHY: HM Mammogram: NORMAL (ref 0–4)

## 2015-11-04 IMAGING — CR DG HAND 2V*L*
1 series · 1 of 1 positions shown · non-contrast
Comparison: None.

CLINICAL DATA: Pain in the 4th and 5th metacarpal regions.

EXAM:
LEFT HAND - 2 VIEW

[PA]
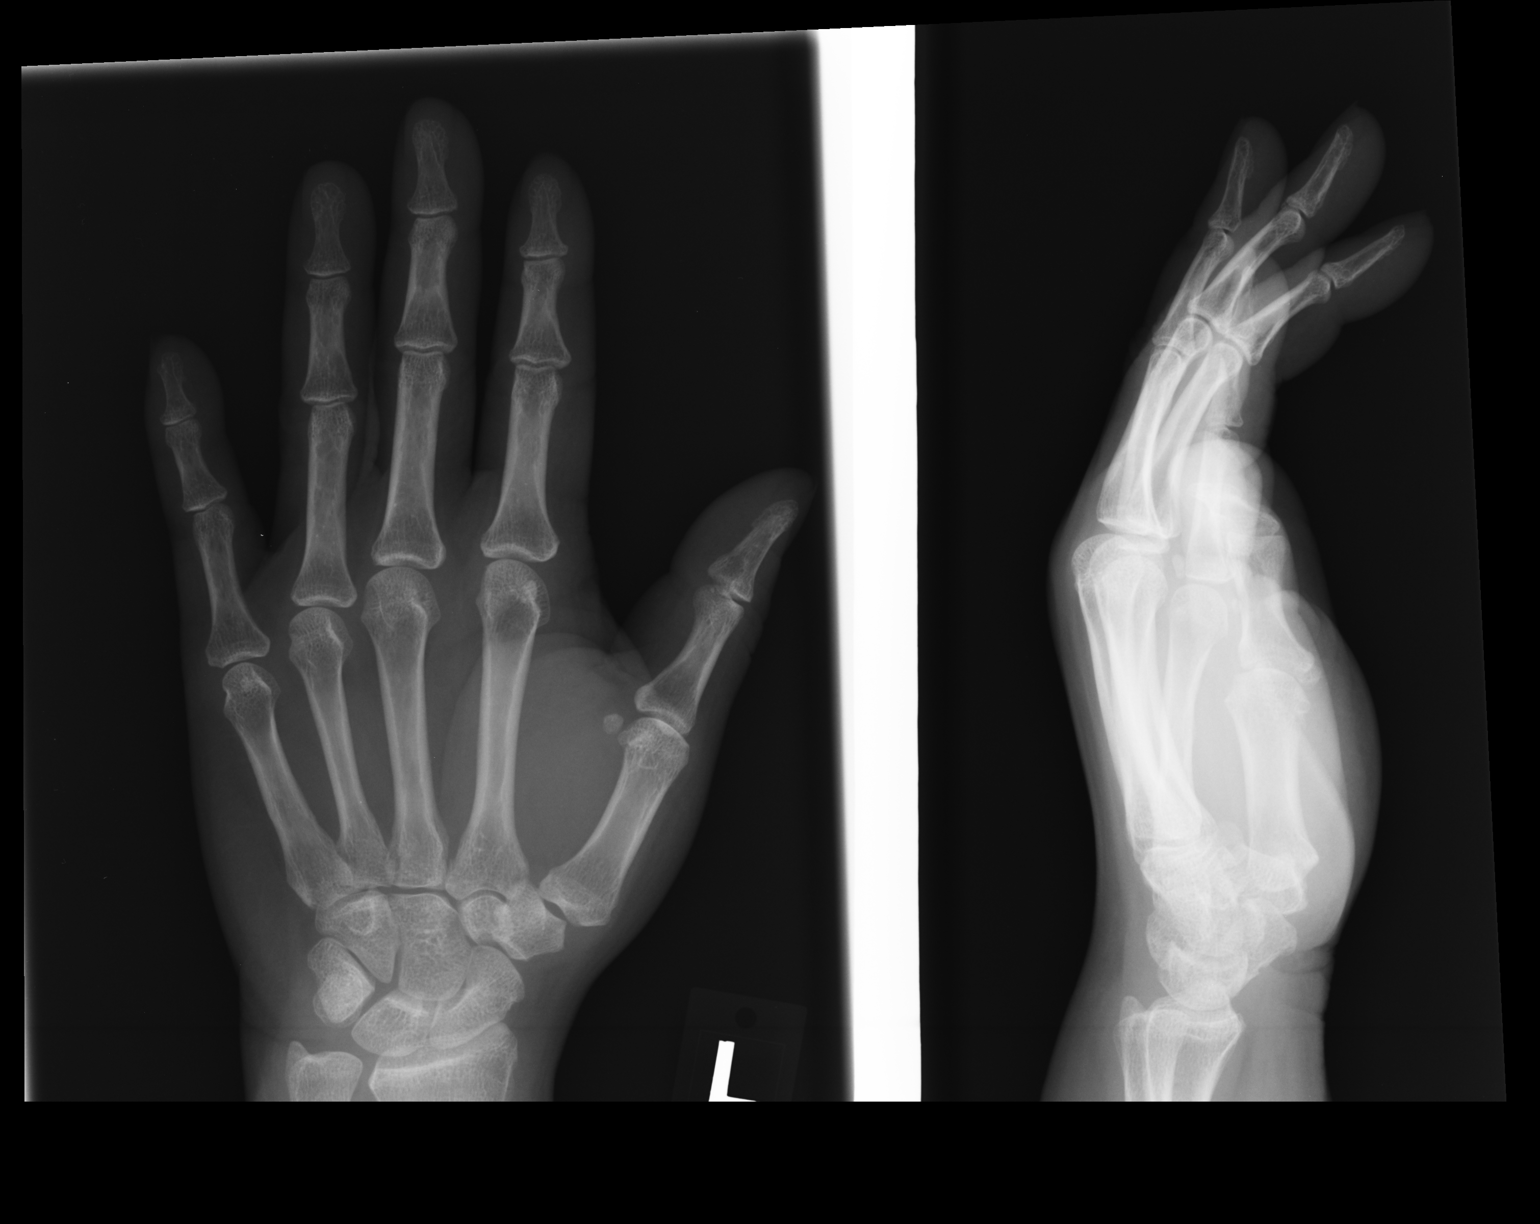

[1 of 1 positions shown; findings below may reference images not displayed]

FINDINGS: No fracture or bone lesion. The joints are normally space and
aligned. The soft tissues are unremarkable.
IMPRESSION: Negative.

## 2015-12-25 ENCOUNTER — Encounter: Payer: Self-pay | Admitting: Family Medicine

## 2015-12-30 ENCOUNTER — Encounter: Payer: Self-pay | Admitting: Family Medicine

## 2016-05-04 ENCOUNTER — Encounter: Payer: Self-pay | Admitting: Family Medicine

## 2016-05-04 ENCOUNTER — Encounter: Payer: Self-pay | Admitting: Behavioral Health

## 2016-05-04 ENCOUNTER — Telehealth: Payer: Self-pay | Admitting: Behavioral Health

## 2016-05-04 NOTE — Telephone Encounter (Signed)
Pre-Visit Call completed with patient and chart updated.   Pre-Visit Info documented in Specialty Comments under SnapShot.    

## 2016-05-05 ENCOUNTER — Encounter: Payer: Self-pay | Admitting: Family Medicine

## 2016-05-05 ENCOUNTER — Ambulatory Visit (INDEPENDENT_AMBULATORY_CARE_PROVIDER_SITE_OTHER): Payer: 59 | Admitting: Family Medicine

## 2016-05-05 VITALS — BP 124/82 | HR 58 | Temp 98.1°F | Ht 62.0 in | Wt 181.0 lb

## 2016-05-05 DIAGNOSIS — Z130 Encounter for screening for diseases of the blood and blood-forming organs and certain disorders involving the immune mechanism: Secondary | ICD-10-CM | POA: Diagnosis not present

## 2016-05-05 DIAGNOSIS — M791 Myalgia, unspecified site: Secondary | ICD-10-CM

## 2016-05-05 DIAGNOSIS — Z0000 Encounter for general adult medical examination without abnormal findings: Secondary | ICD-10-CM

## 2016-05-05 DIAGNOSIS — G4700 Insomnia, unspecified: Secondary | ICD-10-CM

## 2016-05-05 DIAGNOSIS — Z13220 Encounter for screening for lipoid disorders: Secondary | ICD-10-CM | POA: Diagnosis not present

## 2016-05-05 DIAGNOSIS — Z131 Encounter for screening for diabetes mellitus: Secondary | ICD-10-CM

## 2016-05-05 DIAGNOSIS — E663 Overweight: Secondary | ICD-10-CM

## 2016-05-05 DIAGNOSIS — F411 Generalized anxiety disorder: Secondary | ICD-10-CM | POA: Diagnosis not present

## 2016-05-05 LAB — HEMOGLOBIN A1C: HEMOGLOBIN A1C: 5.2 % (ref 4.6–6.5)

## 2016-05-05 LAB — CBC
HCT: 38.1 % (ref 36.0–46.0)
HEMOGLOBIN: 12.3 g/dL (ref 12.0–15.0)
MCHC: 32.3 g/dL (ref 30.0–36.0)
MCV: 90.3 fl (ref 78.0–100.0)
PLATELETS: 331 10*3/uL (ref 150.0–400.0)
RBC: 4.23 Mil/uL (ref 3.87–5.11)
RDW: 14.1 % (ref 11.5–15.5)
WBC: 5.4 10*3/uL (ref 4.0–10.5)

## 2016-05-05 LAB — LIPID PANEL
CHOL/HDL RATIO: 4
Cholesterol: 173 mg/dL (ref 0–200)
HDL: 46 mg/dL (ref >39.00)
LDL Cholesterol: 111 mg/dL — ABNORMAL HIGH (ref 0–99)
NONHDL: 126.77
Triglycerides: 77 mg/dL (ref 0.0–149.0)
VLDL: 15.4 mg/dL (ref 0.0–40.0)

## 2016-05-05 LAB — COMPREHENSIVE METABOLIC PANEL
ALBUMIN: 4 g/dL (ref 3.5–5.2)
ALK PHOS: 73 U/L (ref 39–117)
ALT: 15 U/L (ref 0–35)
AST: 17 U/L (ref 0–37)
BUN: 6 mg/dL (ref 6–23)
CHLORIDE: 105 meq/L (ref 96–112)
CO2: 26 mEq/L (ref 19–32)
Calcium: 9.4 mg/dL (ref 8.4–10.5)
Creatinine, Ser: 0.73 mg/dL (ref 0.40–1.20)
GFR: 107.25 mL/min (ref >60.00)
GLUCOSE: 80 mg/dL (ref 70–99)
POTASSIUM: 4 meq/L (ref 3.5–5.1)
SODIUM: 137 meq/L (ref 135–145)
Total Bilirubin: 0.5 mg/dL (ref 0.2–1.2)
Total Protein: 7.5 g/dL (ref 6.0–8.3)

## 2016-05-05 MED ORDER — MELOXICAM 15 MG PO TABS: ORAL_TABLET | ORAL | Status: DC

## 2016-05-05 MED ORDER — CLONAZEPAM 1 MG PO TABS: ORAL_TABLET | ORAL | Status: DC

## 2016-05-05 NOTE — Addendum Note (Signed)
Addended by: Lamar Blinks C on: 05/05/2016 02:03 PM   Modules accepted: Level of Service

## 2016-05-05 NOTE — Progress Notes (Signed)
Pre visit review using our clinic review tool, if applicable. No additional management support is needed unless otherwise documented below in the visit note. 

## 2016-05-05 NOTE — Progress Notes (Signed)
[place] [facility] [address], [place], [place] [address] [phone]  Date:  05/05/2016   Name:  Melinda Ferrell   DOB:  Nov 23, 1961   MRN:  [id number]  PCP:  Lamar Blinks, MD    Chief Complaint: Annual Exam   History of Present Illness:  Melinda Ferrell is a 55 y.o. very pleasant female patient who presents with the following:  Last seen by myself a year ago for PE.   Mammo, pap, colon are all UTD.  She does seen an OBGYN. She has implanon for contraception  Flu shot and tetanus are UTD She does need hep C screening - last labs a year ago She has not eaten anything yet today.  We will do labs for her The implanon controls her menorrhagia.    She did have breast cancer in 2004- this is in remission and she has regular mammograms Her OBG does manage her thyroid for her    BP Readings from Last 3 Encounters:  05/05/16 124/82  05/04/15 100/70  04/20/14 120/82   Wt Readings from Last 3 Encounters:  05/05/16 181 lb (82.101 kg)  05/04/15 165 lb 6.4 oz (75.025 kg)  04/20/14 178 lb 3.2 oz (80.831 kg)   She did quit smoking and then gained some weight.  She quite 6 months ago and has done well with quitting.  She is trying to get her diet back together  She uses meloxicam for prn muscle pains. She does not use it every day She takes klonopin 0.5- admits that she is taking the 0.5 during the day and 1.5mg  at bedtime.  (rx is for 0.5 day and 1.0 mg at night) She does have some anxiety, and difficulty falling asleep at night.  She has always had some sleep trouble, and cannot stay asleep She did try trazodone but this did not work that well for her insomnia. The klonopin helps her a lot and she wishes to continue to take it.  She quit drinking 5 years ago- she is proud of being sober  She does not have any sx of depression  Patient Active Problem List   Diagnosis Date Noted  . Dysmenorrhea 11/19/2012    Past Medical  History  Diagnosis Date  . Cancer [facility]) 2004    breast  . Alcohol abuse     resolved 2012  . Allergy   . Clotting disorder [facility])     Past Surgical History  Procedure Laterality Date  . Appendectomy    . Breast surgery      Social History  Substance Use Topics  . Smoking status: Former Smoker -- 0.20 packs/day    Types: Cigarettes    Quit date: 11/21/2015  . Smokeless tobacco: Never Used  . Alcohol Use: No    Family History  Problem Relation Age of Onset  . Hypertension Mother   . Gout Mother   . Diabetes Father   . Hyperlipidemia Father   . Hypertension Father     Allergies  Allergen Reactions  . Sulfa Antibiotics     Medication list has been reviewed and updated.  Current Outpatient Prescriptions on File Prior to Visit  Medication Sig Dispense Refill  . cetirizine (ZYRTEC) 10 MG tablet Take 10 mg by mouth daily.    . clonazePAM (KLONOPIN) 0.5 MG tablet Take 1 tablet during the day and 2 at bedtime. 270 tablet 1  . etonogestrel (IMPLANON) 68 MG IMPL implant Inject  1 each into the skin once.    . Meloxicam (MOBIC PO) Take 1 tablet by mouth as needed.     No current facility-administered medications on file prior to visit.    Review of Systems:  As per HPI- otherwise negative.   Physical Examination: Filed Vitals:   05/05/16 1309  BP: 124/82  Pulse: 58  Temp: 98.1 F (36.7 C)   Filed Vitals:   05/05/16 1309  Height: 5\' 2"  (1.575 m)  Weight: 181 lb (82.101 kg)   Body mass index is 33.1 kg/(m^2). Ideal Body Weight: Weight in (lb) to have BMI = 25: 136.4  GEN: WDWN, NAD, Non-toxic, A & O x 3, obese, looks well HEENT: Atraumatic, Normocephalic. Neck supple. No masses, No LAD.  Bilateral TM wnl, oropharynx normal.  PEERL,EOMI.   Ears and Nose: No external deformity. CV: RRR, No M/G/R. No JVD. No thrill. No extra heart sounds. PULM: CTA B, no wheezes, crackles, rhonchi. No retractions. No resp. distress. No accessory muscle use. ABD: S, NT,  ND EXTR: No c/c/e NEURO Normal gait.  PSYCH: Normally interactive. Conversant. Not depressed or anxious appearing.  Calm demeanor.    Assessment and Plan: Physical exam  GAD (generalized anxiety disorder) - Plan: clonazePAM (KLONOPIN) 1 MG tablet  Screening for diabetes mellitus - Plan: Comprehensive metabolic panel, Hemoglobin A1c  Screening for deficiency anemia - Plan: CBC  Screening for hyperlipidemia - Plan: Lipid panel  Muscle pain - Plan: meloxicam (MOBIC) 15 MG tablet  Insomnia - Plan: clonazePAM (KLONOPIN) 1 MG tablet  Overweight  Adjusted her klonpin as below. Will have her take 0.5 day and 1.5 mg qhs for sleep.  Cautioned against combining with any other sedating medications and against driving with this medication UDS today Refilled mobic as well Labs pending She quit smoking- celebrated with her. She will work on losing the resultant pounds   Will plan further follow- up pending labs.   Signed Lamar Blinks, MD

## 2016-05-05 NOTE — Patient Instructions (Signed)
Great job quitting smoking!  Work on losing the weight you lost gradually over the next 6 - 9 months. Use the mobic as needed for occasional aches and pains. You can alternate this with tylenol I will be in touch with your labs asap I increased your klonpin to the 1mg  strength.  Please use this with care and do not combine with any other sedating medications.   The lab will have you do a drug screen today as well

## 2016-05-12 ENCOUNTER — Encounter: Payer: Self-pay | Admitting: Family Medicine

## 2016-05-24 ENCOUNTER — Encounter: Payer: Self-pay | Admitting: Family Medicine

## 2016-05-26 NOTE — Telephone Encounter (Signed)
[place] can you take a look at this one. I'm not sure what to do about this.

## 2016-06-01 ENCOUNTER — Encounter: Payer: Self-pay | Admitting: Family Medicine

## 2016-09-01 ENCOUNTER — Encounter: Payer: Self-pay | Admitting: Family Medicine

## 2016-10-17 ENCOUNTER — Other Ambulatory Visit: Payer: Self-pay | Admitting: Family Medicine

## 2016-10-17 DIAGNOSIS — F411 Generalized anxiety disorder: Secondary | ICD-10-CM

## 2016-10-17 DIAGNOSIS — G4700 Insomnia, unspecified: Secondary | ICD-10-CM

## 2016-10-17 NOTE — Telephone Encounter (Signed)
Received refill request on CLONAZEPAM 1 MG TABLET . Last office visit and refill on 05/05/16. Is it ok to refill? Please advise.

## 2016-11-05 ENCOUNTER — Encounter: Payer: Self-pay | Admitting: Family Medicine

## 2017-05-10 NOTE — Progress Notes (Addendum)
[place] at [facility] [address] [place], [place] [address] [phone]  Date:  05/11/2017   Name:  Melinda Ferrell   DOB:  [birth year]   MRN:  [id number]  PCP:  Darreld Mclean, MD    Chief Complaint: Annual Exam (Pt here for CPE with fasting labs. Pt request labs go to [facility]. Last PAP and mammogram: 07/2016. Refills needed. )   History of Present Illness:  Melinda Ferrell is a 56 y.o. very pleasant female patient who presents with the following:  Here today for her annual CPE History of breast cancer in 2004 She sees Dr. Raphael Gibney for her OBG care  mammo 9/16 Colonoscopy 6/14 Her tdap and flu shots are UTD Last labs about one year ago- looked fine, lipids acceptable, A1c normal  Her weight is still in obese range- she is having a hard time losing weight She is walking about 2 miles a day most days of the week She has really worked on changing her diet; however she is not able to lose any weight unless she goes to extreme measures.  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  She prefers this to mobic She may take up to 2 pills a day- would like an rx for the 800 mg strength   No GI symptoms, no gastritis   She is taking her klonopin 1mg , 2 total per day.  This is working well for her anxiety and she is sleeping well  Mood is "wonderful," she is sleeping well Her job is going well for her, she notes "peace of mind" and is feeling happy overall  BP Readings from Last 3 Encounters:  05/11/17 110/72  05/05/16 124/82  05/04/15 100/70   Wt Readings from Last 3 Encounters:  05/11/17 182 lb (82.6 kg)  05/05/16 181 lb (82.1 kg)  05/04/15 165 lb 6.4 oz (75 kg)   NCCSR: filled a 90 day supply of clonazepam on 3/23 No other  entries noted She is not drinking alcohol   Patient Active Problem List   Diagnosis Date Noted  . Insomnia 05/05/2016  . Dysmenorrhea 11/19/2012    Past Medical History:  Diagnosis Date  . Alcohol abuse    resolved 2012  . Allergy   . Cancer [facility]) 2004   breast  . Clotting disorder [facility])     Past Surgical History:  Procedure Laterality Date  . APPENDECTOMY    . BREAST SURGERY      Social History  Substance Use Topics  . Smoking status: Former Smoker    Packs/day: 0.20    Types: Cigarettes    Quit date: 11/21/2015  . Smokeless tobacco: Never Used  . Alcohol use No    Family History  Problem Relation Age of Onset  . Hypertension Mother   . Gout Mother   . Diabetes Father   . Hyperlipidemia Father   . Hypertension Father     Allergies  Allergen Reactions  . Sulfa Antibiotics     Medication list has been reviewed and updated.  Current Outpatient Prescriptions on File Prior to Visit  Medication Sig Dispense Refill  . clonazePAM (KLONOPIN) 1 MG tablet TAKE 1/2 TABLET DURING THE DAY AND 1 AND 1/2 TABLETS AT  BEDTIME IF NEEDED 180 tablet 1  . etonogestrel (IMPLANON) 68 MG IMPL implant Inject 1 each into the skin once.    Marland Kitchen ibuprofen (ADVIL,MOTRIN) 200 MG tablet Take 200 mg by mouth every 6 (six) hours as needed. Take 2 tablets by mouth as needed.     No current facility-administered medications on file prior to visit.     Review of Systems:  As per HPI- otherwise negative. No CP or SOB, no fever or chills No nausea, vomiting or diarrhea   Physical Examination: Vitals:   05/11/17 0828  BP: 110/72  Pulse: 64  Temp: 98.2 F (36.8 C)   Vitals:   05/11/17 0828  Weight: 182 lb (82.6 kg)  Height: 5\' 2"  (1.575 m)   Body mass index is 33.29 kg/m. Ideal Body Weight: Weight in (lb) to have BMI = 25: 136.4  GEN: WDWN, NAD, Non-toxic, A & O x 3, obese, looks well HEENT: Atraumatic, Normocephalic. Neck supple. No masses, No LAD.  Bilateral TM wnl,  oropharynx normal.  PEERL,EOMI.   Ears and Nose: No external deformity. CV: RRR, No M/G/R. No JVD. No thrill. No extra heart sounds. PULM: CTA B, no wheezes, crackles, rhonchi. No retractions. No resp. distress. No accessory muscle use. ABD: S, NT, ND. No rebound. No HSM. EXTR: No c/c/e NEURO Normal gait.  PSYCH: Normally interactive. Conversant. Not depressed or anxious appearing.  Calm demeanor.    Assessment and Plan: Physical exam  Chronic bilateral low back pain without sciatica - Plan: ibuprofen (ADVIL,MOTRIN) 800 MG tablet  Overweight - Plan: phentermine 15 MG capsule, CANCELED: TSH  Weight loss counseling, encounter for - Plan: phentermine 15 MG capsule, CANCELED: TSH  Screening for diabetes mellitus - Plan: Comprehensive metabolic panel, Hemoglobin A1c  Screening for deficiency anemia - Plan: CBC  Screening for hyperlipidemia - Plan: Lipid panel  GAD (generalized anxiety disorder) - Plan: clonazePAM (KLONOPIN) 1 MG tablet  Primary insomnia - Plan: clonazePAM (KLONOPIN) 1 MG tablet  Here today for a CPE Refilled ibuprofen- gave her the 800 mg strength- for back pain to use as needed Declines a thyroid check due to insurance coverage concerns Otherwise labs pending as above Refilled her klonopin for 6 months Will try phentermine 15 mg q am- she will contact me in about one week with an update about her weight, plan to check here in 3 months Will plan further follow- up pending labs.   Signed Lamar Blinks, MD  Received her labs  Results for orders placed or performed in visit on 05/11/17  CBC  Result Value Ref Range   WBC 5.8 3.4 - 10.8 x10E3/uL   RBC 4.04 3.77 - 5.28 x10E6/uL   Hemoglobin 12.2 11.1 - 15.9 g/dL   Hematocrit 37.7 34.0 - 46.6 %   MCV 93 79 - 97 fL   MCH 30.2 26.6 - 33.0 pg   MCHC 32.4 31.5 - 35.7 g/dL   RDW 14.2 12.3 - 15.4 %   Platelets 330 150 - 379 x10E3/uL  Comprehensive metabolic panel  Result Value Ref Range   Glucose 99 65 - 99  mg/dL   BUN 8 6 - 24 mg/dL   Creatinine, Ser 0.78 0.57 - 1.00 mg/dL   GFR calc non Af Amer 86 >59 mL/min/1.73   GFR calc Af Amer 100 >59 mL/min/1.73   BUN/Creatinine Ratio 10 9 - 23   Sodium 138 134 - 144 mmol/L   Potassium 4.4 3.5 - 5.2 mmol/L   Chloride 104 96 - 106 mmol/L  CO2 20 18 - 29 mmol/L   Calcium 9.0 8.7 - 10.2 mg/dL   Total Protein 6.8 6.0 - 8.5 g/dL   Albumin 3.5 3.5 - 5.5 g/dL   Globulin, Total 3.3 1.5 - 4.5 g/dL   Albumin/Globulin Ratio 1.1 (L) 1.2 - 2.2   Bilirubin Total 0.5 0.0 - 1.2 mg/dL   Alkaline Phosphatase 87 39 - 117 IU/L   AST 19 0 - 40 IU/L   ALT 18 0 - 32 IU/L  Hemoglobin A1c  Result Value Ref Range   Hgb A1c MFr Bld 5.0 4.8 - 5.6 %   Est. average glucose Bld gHb Est-mCnc 97 mg/dL  Lipid panel  Result Value Ref Range   Cholesterol, Total 185 100 - 199 mg/dL   Triglycerides 89 0 - 149 mg/dL   HDL 59 >39 mg/dL   VLDL Cholesterol Cal 18 5 - 40 mg/dL   LDL Calculated 108 (H) 0 - 99 mg/dL   Chol/HDL Ratio 3.1 0.0 - 4.4 ratio

## 2017-05-11 ENCOUNTER — Encounter: Payer: Self-pay | Admitting: Family Medicine

## 2017-05-11 ENCOUNTER — Telehealth: Payer: Self-pay | Admitting: Family Medicine

## 2017-05-11 ENCOUNTER — Ambulatory Visit (INDEPENDENT_AMBULATORY_CARE_PROVIDER_SITE_OTHER): Payer: 59 | Admitting: Family Medicine

## 2017-05-11 VITALS — BP 110/72 | HR 64 | Temp 98.2°F | Ht 62.0 in | Wt 182.0 lb

## 2017-05-11 DIAGNOSIS — E663 Overweight: Secondary | ICD-10-CM

## 2017-05-11 DIAGNOSIS — F411 Generalized anxiety disorder: Secondary | ICD-10-CM

## 2017-05-11 DIAGNOSIS — Z130 Encounter for screening for diseases of the blood and blood-forming organs and certain disorders involving the immune mechanism: Secondary | ICD-10-CM

## 2017-05-11 DIAGNOSIS — G8929 Other chronic pain: Secondary | ICD-10-CM

## 2017-05-11 DIAGNOSIS — Z713 Dietary counseling and surveillance: Secondary | ICD-10-CM | POA: Diagnosis not present

## 2017-05-11 DIAGNOSIS — Z131 Encounter for screening for diabetes mellitus: Secondary | ICD-10-CM | POA: Diagnosis not present

## 2017-05-11 DIAGNOSIS — Z0000 Encounter for general adult medical examination without abnormal findings: Secondary | ICD-10-CM | POA: Diagnosis not present

## 2017-05-11 DIAGNOSIS — M545 Low back pain: Secondary | ICD-10-CM | POA: Diagnosis not present

## 2017-05-11 DIAGNOSIS — F5101 Primary insomnia: Secondary | ICD-10-CM | POA: Diagnosis not present

## 2017-05-11 DIAGNOSIS — Z13220 Encounter for screening for lipoid disorders: Secondary | ICD-10-CM | POA: Diagnosis not present

## 2017-05-11 MED ORDER — PHENTERMINE HCL 15 MG PO CAPS: 15.0000 mg | ORAL_CAPSULE | ORAL | 2 refills | Status: DC

## 2017-05-11 MED ORDER — IBUPROFEN 800 MG PO TABS: 800.0000 mg | ORAL_TABLET | Freq: Three times a day (TID) | ORAL | 2 refills | Status: DC | PRN

## 2017-05-11 MED ORDER — CLONAZEPAM 1 MG PO TABS: ORAL_TABLET | ORAL | 1 refills | Status: DC

## 2017-05-11 NOTE — Telephone Encounter (Signed)
Pharmacy has been updated.

## 2017-05-11 NOTE — Telephone Encounter (Signed)
[facility] states [facility] will not transfer [facility] to [facility] patient requesting new Rx sent to  [place], [place] [phone] (Phone) [phone] (Fax)     clonazePAM (KLONOPIN) 1 MG tablet  ibuprofen (ADVIL,MOTRIN) 800 MG tablet  phentermine 15 MG capsule

## 2017-05-11 NOTE — Telephone Encounter (Signed)
°  Relation to DL:OPRA Call back number: [phone] Jerilynn Mages) Pharmacy: [facility] at [address], [place] [employer] [place] [address] [phone]   Reason for call:  Patient called checking on the status of my chart message and would like [facility] from her chart and would like [facility] at [address], [place] [address] [phone]

## 2017-05-11 NOTE — Telephone Encounter (Signed)
Prescriptions sent to [place] as requested.

## 2017-05-11 NOTE — Telephone Encounter (Signed)
[facility] on [address] called said pt request we send scripts to them. We sent to [facility] [address] and they wont transfer the meds due to their policy of having to fill it the first time. *please [facility] on [address] now instead.  Phentermine and clonazepam (klonopin).

## 2017-05-11 NOTE — Patient Instructions (Signed)
It was nice to see you today!  Take care and I will be in touch with your labs Continue to use the klonopin as needed We will also have you start on phentermine 15 mg a day for weight loss.    Please let me know how your weight is doing after 1 month of treatment.  Please come and see me in 3 months to check on your progress

## 2017-05-12 ENCOUNTER — Other Ambulatory Visit: Payer: Self-pay | Admitting: Emergency Medicine

## 2017-05-12 ENCOUNTER — Telehealth: Payer: Self-pay

## 2017-05-12 ENCOUNTER — Encounter: Payer: Self-pay | Admitting: Family Medicine

## 2017-05-12 LAB — CBC
Hematocrit: 37.7 % (ref 34.0–46.6)
Hemoglobin: 12.2 g/dL (ref 11.1–15.9)
MCH: 30.2 pg (ref 26.6–33.0)
MCHC: 32.4 g/dL (ref 31.5–35.7)
MCV: 93 fL (ref 79–97)
PLATELETS: 330 10*3/uL (ref 150–379)
RBC: 4.04 x10E6/uL (ref 3.77–5.28)
RDW: 14.2 % (ref 12.3–15.4)
WBC: 5.8 10*3/uL (ref 3.4–10.8)

## 2017-05-12 LAB — HEMOGLOBIN A1C
Est. average glucose Bld gHb Est-mCnc: 97 mg/dL
Hgb A1c MFr Bld: 5 % (ref 4.8–5.6)

## 2017-05-12 LAB — COMPREHENSIVE METABOLIC PANEL
A/G RATIO: 1.1 — AB (ref 1.2–2.2)
ALT: 18 IU/L (ref 0–32)
AST: 19 IU/L (ref 0–40)
Albumin: 3.5 g/dL (ref 3.5–5.5)
Alkaline Phosphatase: 87 IU/L (ref 39–117)
BILIRUBIN TOTAL: 0.5 mg/dL (ref 0.0–1.2)
BUN/Creatinine Ratio: 10 (ref 9–23)
BUN: 8 mg/dL (ref 6–24)
CHLORIDE: 104 mmol/L (ref 96–106)
CO2: 20 mmol/L (ref 18–29)
Calcium: 9 mg/dL (ref 8.7–10.2)
Creatinine, Ser: 0.78 mg/dL (ref 0.57–1.00)
GFR calc non Af Amer: 86 mL/min/{1.73_m2} (ref >59)
GFR, EST AFRICAN AMERICAN: 100 mL/min/{1.73_m2} (ref >59)
GLOBULIN, TOTAL: 3.3 g/dL (ref 1.5–4.5)
Glucose: 99 mg/dL (ref 65–99)
POTASSIUM: 4.4 mmol/L (ref 3.5–5.2)
SODIUM: 138 mmol/L (ref 134–144)
TOTAL PROTEIN: 6.8 g/dL (ref 6.0–8.5)

## 2017-05-12 LAB — LIPID PANEL
CHOL/HDL RATIO: 3.1 ratio (ref 0.0–4.4)
CHOLESTEROL TOTAL: 185 mg/dL (ref 100–199)
HDL: 59 mg/dL (ref >39)
LDL CALC: 108 mg/dL — AB (ref 0–99)
Triglycerides: 89 mg/dL (ref 0–149)
VLDL Cholesterol Cal: 18 mg/dL (ref 5–40)

## 2017-05-12 NOTE — Telephone Encounter (Signed)
Received PA approval through 08/12/2017. File ID: [id number]. PA approval faxed to [facility].

## 2017-05-12 NOTE — Telephone Encounter (Signed)
Melinda Ferrell ([name]) - [id number] Phentermine HCl 15MG  OR CAPS Status: PA Request  Created: June 8th, 2018  Sent: June 8th, 2018  Awaiting determination

## 2017-05-12 NOTE — Telephone Encounter (Signed)
PA initiated via Covermymeds; KEY: [id number]. Awaiting determination.

## 2017-05-12 NOTE — Telephone Encounter (Signed)
Melinda Ferrell (Key: [id number]) - [id number] Phentermine HCl 15MG  OR CAPS Status: PA Response - Approved  Created: June 8th, 2018  Sent: June 8th, 2018

## 2017-05-25 ENCOUNTER — Encounter: Payer: Self-pay | Admitting: Family Medicine

## 2017-07-27 ENCOUNTER — Encounter: Payer: Self-pay | Admitting: Family Medicine

## 2017-07-27 MED ORDER — PHENTERMINE HCL 37.5 MG PO CAPS: 37.5000 mg | ORAL_CAPSULE | ORAL | 0 refills | Status: DC

## 2017-07-28 ENCOUNTER — Telehealth: Payer: Self-pay

## 2017-07-28 ENCOUNTER — Encounter: Payer: Self-pay | Admitting: Family Medicine

## 2017-07-28 NOTE — Telephone Encounter (Signed)
Spoke w/ Pt, informed her that PA has been initiated, awaiting response. Pt verbalized understanding and thanked me for my help.

## 2017-07-28 NOTE — Telephone Encounter (Signed)
Patient informed of CMA skpe regarding initiating PA for phentermine 37.5 MG capsule, patient requested to speak with  Vilma Prader again regarding my chart message, patient would like to speak with [facility] via phone directly, please advise

## 2017-07-28 NOTE — Telephone Encounter (Signed)
PA initiated via Covermymeds; KEY: [id number]. Awaiting determination.

## 2017-07-28 NOTE — Telephone Encounter (Signed)
Patient stating PA form faxed 2x from pharmacy, patient would like authorization iniated today, please advise

## 2017-07-28 NOTE — Telephone Encounter (Signed)
Patient would like to discuss my chart message please advise beset # [phone]

## 2017-08-01 ENCOUNTER — Telehealth: Payer: Self-pay | Admitting: Family Medicine

## 2017-08-01 ENCOUNTER — Encounter: Payer: Self-pay | Admitting: Family Medicine

## 2017-08-01 NOTE — Telephone Encounter (Signed)
Pt would like make provider aware that she have an apt with provider arthur stringer at [facility] (GYN) tomorrow. She said that she was told by provider to come in to have a weight check with nurse. She would like to just have GYN office fax over weight from apt, pt doesn't want to have insurance bill for a weight check.

## 2017-08-02 ENCOUNTER — Encounter: Payer: Self-pay | Admitting: Family Medicine

## 2017-08-02 LAB — HM MAMMOGRAPHY

## 2017-08-02 NOTE — Progress Notes (Unsigned)
Patient in for weight check per order from Dr. Lamar Blinks, MD.  Weight = 177.2lb.

## 2017-08-03 NOTE — Telephone Encounter (Signed)
Relation to pt: self Call back number: [phone] (M)   Reason for call:  Patient checking on the status if her weight was called into her insurance, patient would like to know the status, please advise

## 2017-08-08 ENCOUNTER — Encounter: Payer: Self-pay | Admitting: Family Medicine

## 2017-08-08 NOTE — Telephone Encounter (Signed)
Wt Readings from Last 3 Encounters:  08/02/17 177 lb 3.2 oz (80.4 kg)  05/11/17 182 lb (82.6 kg)  05/05/16 181 lb (82.1 kg)

## 2017-08-10 ENCOUNTER — Encounter: Payer: Self-pay | Admitting: Family Medicine

## 2017-08-10 NOTE — Telephone Encounter (Signed)
Request Reference Number: [id number]. PHENTERMINE CAP 37.5MG  is denied for not meeting the prior authorization requirement(s). For further questions, call [phone]. Appeals are not supported through [place]. Please refer to the fax case notice for appeals information and instructions.   However, have not seen faxed case notice. Will try calling number above to request re-fax.

## 2017-08-12 ENCOUNTER — Other Ambulatory Visit: Payer: Self-pay | Admitting: Family Medicine

## 2017-08-14 ENCOUNTER — Encounter: Payer: Self-pay | Admitting: Family Medicine

## 2017-08-14 ENCOUNTER — Other Ambulatory Visit: Payer: Self-pay | Admitting: Emergency Medicine

## 2017-08-14 MED ORDER — PHENTERMINE HCL 37.5 MG PO TABS: 37.5000 mg | ORAL_TABLET | Freq: Every day | ORAL | 1 refills | Status: DC

## 2017-08-14 NOTE — Telephone Encounter (Signed)
Requesting: phentermine 37.5 MG capsule Contract:  UDS Last OV: 05/11/17 Last Refill: 07/27/17  Please Advise

## 2017-08-15 MED ORDER — PHENTERMINE HCL 37.5 MG PO TABS: 37.5000 mg | ORAL_TABLET | Freq: Every day | ORAL | 1 refills | Status: DC

## 2017-08-15 NOTE — Telephone Encounter (Signed)
Dr. Lorelei Pont- I tried re-sending the phentermine to Kristopher Oppenheim at [facility] but forgot that phentermine had to be signed. Can you recall/resend this for me?

## 2017-08-15 NOTE — Telephone Encounter (Signed)
Per Dr. Lorelei Pont, Pt has been informed that if she wants to continue phentermine she will have to pay out of pocket for medication at this time.

## 2017-08-15 NOTE — Addendum Note (Signed)
Addended by: Lamar Blinks C on: 08/15/2017 06:04 AM   Modules accepted: Orders

## 2017-11-02 ENCOUNTER — Other Ambulatory Visit: Payer: Self-pay | Admitting: Family Medicine

## 2017-11-02 DIAGNOSIS — M545 Low back pain, unspecified: Secondary | ICD-10-CM

## 2017-11-02 DIAGNOSIS — G8929 Other chronic pain: Secondary | ICD-10-CM

## 2017-11-23 ENCOUNTER — Other Ambulatory Visit: Payer: Self-pay | Admitting: Family Medicine

## 2017-11-23 DIAGNOSIS — F411 Generalized anxiety disorder: Secondary | ICD-10-CM

## 2017-11-23 DIAGNOSIS — F5101 Primary insomnia: Secondary | ICD-10-CM

## 2017-11-24 NOTE — Telephone Encounter (Signed)
Pt is requesting refill on clonazepam 1mg .

## 2018-05-12 NOTE — Progress Notes (Addendum)
[facility] [place], [place], [place] [address] [phone]  Date:  05/14/2018   Name:  Melinda Ferrell   DOB:  13-Oct-1961   MRN:  [id number]  PCP:  Darreld Mclean, MD    Chief Complaint: Annual Exam   History of Present Illness:  Melinda Ferrell is a 57 y.o. very pleasant female patient who presents with the following:  Here for a CPE today History of insomnia, breast cancer 2004, former alcohol abuse but now quit I saw her about a year ago for her CPE:  Her weight is still in obese range- she is having a hard time losing weight She is walking about 2 miles a day most days of the week She has really worked on changing her diet; however she is not able to lose any weight unless she goes to extreme measures.  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  She prefers this to mobic She may take up to 2 pills a day- would like an rx for the 800 mg strength  No GI symptoms, no gastritis  She is taking her klonopin 1mg , 2 total per day.  This is working well for her anxiety and she is sleeping well Mood is "wonderful," she is sleeping well Her job is going well for her, she notes "peace of mind" and is feeling happy overall   History of breast cancer in 2004- routine follow-up mammo only She sees Dr. Raphael Gibney for her [place]; will be done in August Colonoscopy 6/14, and upcoming this July as wel  Her tdap and flu shots are UTD Last labs about one year ago- looked fine, lipids acceptable, A1c normal; she is fasting today She will see GYN in August  Wt Readings from Last 3 Encounters:  05/14/18 179 lb (81.2 kg)  08/02/17 177 lb 3.2 oz (80.4 kg)  05/11/17 182 lb (82.6 kg)   Her weight has been stable and she is  "not stressing about this any longer." She does walk about 2 miles a day for exercise  She has not been taking phentermine over over the last several months. She did feel like it helped her with her appetite and would like to go back on this.  She did not notice any adverse SE from taking this med in the past   Her uncle has advanced prostate cancer but othrewise her family is doing well at this time   She is using clonazepam for anxiety- she takes 2 mg a day- she will be due for a refill in a couple of weeks  She also uses ibuprofen 800 a couple of times a day for her back pain She does not feel like phentermine makes her feel more anxious   Patient Active Problem List   Diagnosis Date Noted  . Insomnia 05/05/2016  . Dysmenorrhea 11/19/2012    Past Medical History:  Diagnosis Date  . Alcohol abuse    resolved 2012  . Allergy   . Cancer [facility]) 2004   breast  . Clotting disorder [facility])     Past Surgical History:  Procedure Laterality Date  . APPENDECTOMY    . BREAST SURGERY      Social History  Tobacco Use  . Smoking status: Former Smoker    Packs/day: 0.20    Types: Cigarettes    Last attempt to quit: 11/21/2015    Years since quitting: 2.4  . Smokeless tobacco: Never Used  Substance Use Topics  . Alcohol use: No    Alcohol/week: 0.0 oz  . Drug use: No    Family History  Problem Relation Age of Onset  . Hypertension Mother   . Gout Mother   . Diabetes Father   . Hyperlipidemia Father   . Hypertension Father     Allergies  Allergen Reactions  . Sulfa Antibiotics     Medication list has been reviewed and updated.  Current Outpatient Medications on File Prior to Visit  Medication Sig Dispense Refill  . clonazePAM (KLONOPIN) 1 MG tablet TAKE HALF A TABLET BY MOUTH DURING THE DAY AND 1 TABLET BY MOUTH AT BEDTIME AS NEEDED 180 tablet 1  . diphenhydrAMINE (BENADRYL) 25 MG tablet Take 25 mg by mouth every 6 (six) hours as needed.    . etonogestrel (IMPLANON)  68 MG IMPL implant Inject 1 each into the skin once.    . IBU 800 MG tablet take 1 tablet by mouth every 8 hours if needed 180 tablet 2  . phentermine (ADIPEX-P) 37.5 MG tablet Take 1 tablet (37.5 mg total) by mouth daily before breakfast. 30 tablet 1   No current facility-administered medications on file prior to visit.     Review of Systems:  As per HPI- otherwise negative. No fever or chills No Cp or SOB No rash    Physical Examination: Vitals:   05/14/18 0816  BP: 112/78  Pulse: 86  Resp: 16  SpO2: 100%   Vitals:   05/14/18 0816  Weight: 179 lb (81.2 kg)  Height: 5\' 2"  (1.575 m)   Body mass index is 32.74 kg/m. Ideal Body Weight: Weight in (lb) to have BMI = 25: 136.4  GEN: WDWN, NAD, Non-toxic, A & O x 3, obese, looks well  HEENT: Atraumatic, Normocephalic. Neck supple. No masses, No LAD.  Bilateral TM wnl, oropharynx normal.  PEERL,EOMI.   Ears and Nose: No external deformity. CV: RRR, No M/G/R. No JVD. No thrill. No extra heart sounds. PULM: CTA B, no wheezes, crackles, rhonchi. No retractions. No resp. distress. No accessory muscle use. ABD: S, NT, ND. No rebound. No HSM. EXTR: No c/c/e NEURO Normal gait.  Normal movement of all limbs and normal DTR PSYCH: Normally interactive. Conversant. Not depressed or anxious appearing.  Calm demeanor.   Checked [place]- nothing unexpected today  Assessment and Plan: Physical exam  Screening for diabetes mellitus - Plan: Comprehensive metabolic panel, Hemoglobin A1c  Screening for deficiency anemia - Plan: CBC  Screening for hyperlipidemia - Plan: Lipid panel  Primary insomnia - Plan: clonazePAM (KLONOPIN) 1 MG tablet  Chronic bilateral low back pain without sciatica  GAD (generalized anxiety disorder) - Plan: clonazePAM (KLONOPIN) 1 MG tablet  Obesity (BMI 30.0-34.9) - Plan: phentermine (ADIPEX-P) 37.5 MG tablet  Here today for a CPE She also does see GYN and has an appt coming up with them soon Labs pending  as above Refilled her klonopin She would like to go back on phentermine- rx for her today, asked her to take a 1/2 pill for a few days to make sure no side effects  Will plan further follow- up pending labs. Encouraged continued exercise   Signed Lamar Blinks, MD  Received her labs, message to pt Blood counts are  normal Metabolic profile looks good Hemoglobin A1c is normal- no sign of diabetes or pre-diabetes Cholesterol is overall good!   Keep up the good work Results for orders placed or performed in visit on 05/14/18  CBC  Result Value Ref Range   WBC 4.2 4.0 - 10.5 K/uL   RBC 4.11 3.87 - 5.11 Mil/uL   Platelets 292.0 150.0 - 400.0 K/uL   Hemoglobin 12.9 12.0 - 15.0 g/dL   HCT 38.9 36.0 - 46.0 %   MCV 94.7 78.0 - 100.0 fl   MCHC 33.1 30.0 - 36.0 g/dL   RDW 13.5 11.5 - 15.5 %  Comprehensive metabolic panel  Result Value Ref Range   Sodium 136 135 - 145 mEq/L   Potassium 3.9 3.5 - 5.1 mEq/L   Chloride 102 96 - 112 mEq/L   CO2 25 19 - 32 mEq/L   Glucose, Bld 97 70 - 99 mg/dL   BUN 13 6 - 23 mg/dL   Creatinine, Ser 0.78 0.40 - 1.20 mg/dL   Total Bilirubin 0.7 0.2 - 1.2 mg/dL   Alkaline Phosphatase 87 39 - 117 U/L   AST 17 0 - 37 U/L   ALT 17 0 - 35 U/L   Total Protein 7.4 6.0 - 8.3 g/dL   Albumin 3.9 3.5 - 5.2 g/dL   Calcium 9.3 8.4 - 10.5 mg/dL   GFR 98.61 >60.00 mL/min  Hemoglobin A1c  Result Value Ref Range   Hgb A1c MFr Bld 5.3 4.6 - 6.5 %  Lipid panel  Result Value Ref Range   Cholesterol 195 0 - 200 mg/dL   Triglycerides 159.0 (H) 0.0 - 149.0 mg/dL   HDL 51.50 >39.00 mg/dL   VLDL 31.8 0.0 - 40.0 mg/dL   LDL Cholesterol 112 (H) 0 - 99 mg/dL   Total CHOL/HDL Ratio 4    NonHDL 143.43

## 2018-05-14 ENCOUNTER — Encounter: Payer: Self-pay | Admitting: Family Medicine

## 2018-05-14 ENCOUNTER — Ambulatory Visit (INDEPENDENT_AMBULATORY_CARE_PROVIDER_SITE_OTHER): Payer: 59 | Admitting: Family Medicine

## 2018-05-14 VITALS — BP 112/78 | HR 86 | Resp 16 | Ht 62.0 in | Wt 179.0 lb

## 2018-05-14 DIAGNOSIS — Z0000 Encounter for general adult medical examination without abnormal findings: Secondary | ICD-10-CM

## 2018-05-14 DIAGNOSIS — F5101 Primary insomnia: Secondary | ICD-10-CM | POA: Diagnosis not present

## 2018-05-14 DIAGNOSIS — Z130 Encounter for screening for diseases of the blood and blood-forming organs and certain disorders involving the immune mechanism: Secondary | ICD-10-CM | POA: Diagnosis not present

## 2018-05-14 DIAGNOSIS — G8929 Other chronic pain: Secondary | ICD-10-CM | POA: Diagnosis not present

## 2018-05-14 DIAGNOSIS — F411 Generalized anxiety disorder: Secondary | ICD-10-CM

## 2018-05-14 DIAGNOSIS — E669 Obesity, unspecified: Secondary | ICD-10-CM | POA: Diagnosis not present

## 2018-05-14 DIAGNOSIS — M545 Low back pain, unspecified: Secondary | ICD-10-CM

## 2018-05-14 DIAGNOSIS — Z131 Encounter for screening for diabetes mellitus: Secondary | ICD-10-CM | POA: Diagnosis not present

## 2018-05-14 DIAGNOSIS — Z13220 Encounter for screening for lipoid disorders: Secondary | ICD-10-CM

## 2018-05-14 LAB — LIPID PANEL
CHOL/HDL RATIO: 4
CHOLESTEROL: 195 mg/dL (ref 0–200)
HDL: 51.5 mg/dL (ref >39.00)
LDL CALC: 112 mg/dL — AB (ref 0–99)
NonHDL: 143.43
Triglycerides: 159 mg/dL — ABNORMAL HIGH (ref 0.0–149.0)
VLDL: 31.8 mg/dL (ref 0.0–40.0)

## 2018-05-14 LAB — COMPREHENSIVE METABOLIC PANEL
ALT: 17 U/L (ref 0–35)
AST: 17 U/L (ref 0–37)
Albumin: 3.9 g/dL (ref 3.5–5.2)
Alkaline Phosphatase: 87 U/L (ref 39–117)
BUN: 13 mg/dL (ref 6–23)
CO2: 25 meq/L (ref 19–32)
Calcium: 9.3 mg/dL (ref 8.4–10.5)
Chloride: 102 mEq/L (ref 96–112)
Creatinine, Ser: 0.78 mg/dL (ref 0.40–1.20)
GFR: 98.61 mL/min (ref >60.00)
GLUCOSE: 97 mg/dL (ref 70–99)
POTASSIUM: 3.9 meq/L (ref 3.5–5.1)
Sodium: 136 mEq/L (ref 135–145)
Total Bilirubin: 0.7 mg/dL (ref 0.2–1.2)
Total Protein: 7.4 g/dL (ref 6.0–8.3)

## 2018-05-14 LAB — HEMOGLOBIN A1C: Hgb A1c MFr Bld: 5.3 % (ref 4.6–6.5)

## 2018-05-14 LAB — CBC
HEMATOCRIT: 38.9 % (ref 36.0–46.0)
HEMOGLOBIN: 12.9 g/dL (ref 12.0–15.0)
MCHC: 33.1 g/dL (ref 30.0–36.0)
MCV: 94.7 fl (ref 78.0–100.0)
Platelets: 292 10*3/uL (ref 150.0–400.0)
RBC: 4.11 Mil/uL (ref 3.87–5.11)
RDW: 13.5 % (ref 11.5–15.5)
WBC: 4.2 10*3/uL (ref 4.0–10.5)

## 2018-05-14 MED ORDER — PHENTERMINE HCL 37.5 MG PO TABS: 37.5000 mg | ORAL_TABLET | Freq: Every day | ORAL | 1 refills | Status: DC

## 2018-05-14 MED ORDER — CLONAZEPAM 1 MG PO TABS: ORAL_TABLET | ORAL | 1 refills | Status: DC

## 2018-05-14 NOTE — Patient Instructions (Addendum)
It was good to see you today- take care and I will be in touch with your labs asap  Continue to work on diet and exercise We can restart phentermine if you like- please see me in 3 months for a weight check  Health Maintenance, Female Adopting a healthy lifestyle and getting preventive care can go a long way to promote health and wellness. Talk with your health care provider about what schedule of regular examinations is right for you. This is a good chance for you to check in with your provider about disease prevention and staying healthy. In between checkups, there are plenty of things you can do on your own. Experts have done a lot of research about which lifestyle changes and preventive measures are most likely to keep you healthy. Ask your health care provider for more information. Weight and diet Eat a healthy diet  Be sure to include plenty of vegetables, fruits, low-fat dairy products, and lean protein.  Do not eat a lot of foods high in solid fats, added sugars, or salt.  Get regular exercise. This is one of the most important things you can do for your health. ? Most adults should exercise for at least 150 minutes each week. The exercise should increase your heart rate and make you sweat (moderate-intensity exercise). ? Most adults should also do strengthening exercises at least twice a week. This is in addition to the moderate-intensity exercise.  Maintain a healthy weight  Body mass index (BMI) is a measurement that can be used to identify possible weight problems. It estimates body fat based on height and weight. Your health care provider can help determine your BMI and help you achieve or maintain a healthy weight.  For females 48 years of age and older: ? A BMI below 18.5 is considered underweight. ? A BMI of 18.5 to 24.9 is normal. ? A BMI of 25 to 29.9 is considered overweight. ? A BMI of 30 and above is considered obese.  Watch levels of cholesterol and blood  lipids  You should start having your blood tested for lipids and cholesterol at 56 years of age, then have this test every 5 years.  You may need to have your cholesterol levels checked more often if: ? Your lipid or cholesterol levels are high. ? You are older than 55 years of age. ? You are at high risk for heart disease.  Cancer screening Lung Cancer  Lung cancer screening is recommended for adults 73-77 years old who are at high risk for lung cancer because of a history of smoking.  A yearly low-dose CT scan of the lungs is recommended for people who: ? Currently smoke. ? Have quit within the past 15 years. ? Have at least a 30-pack-year history of smoking. A pack year is smoking an average of one pack of cigarettes a day for 1 year.  Yearly screening should continue until it has been 15 years since you quit.  Yearly screening should stop if you develop a health problem that would prevent you from having lung cancer treatment.  Breast Cancer  Practice breast self-awareness. This means understanding how your breasts normally appear and feel.  It also means doing regular breast self-exams. Let your health care provider know about any changes, no matter how small.  If you are in your 20s or 30s, you should have a clinical breast exam (CBE) by a health care provider every 1-3 years as part of a regular health exam.  If  you are 40 or older, have a CBE every year. Also consider having a breast X-ray (mammogram) every year.  If you have a family history of breast cancer, talk to your health care provider about genetic screening.  If you are at high risk for breast cancer, talk to your health care provider about having an MRI and a mammogram every year.  Breast cancer gene (BRCA) assessment is recommended for women who have family members with BRCA-related cancers. BRCA-related cancers include: ? Breast. ? Ovarian. ? Tubal. ? Peritoneal cancers.  Results of the assessment will  determine the need for genetic counseling and BRCA1 and BRCA2 testing.  Cervical Cancer Your health care provider may recommend that you be screened regularly for cancer of the pelvic organs (ovaries, uterus, and vagina). This screening involves a pelvic examination, including checking for microscopic changes to the surface of your cervix (Pap test). You may be encouraged to have this screening done every 3 years, beginning at age 55.  For women ages 34-65, health care providers may recommend pelvic exams and Pap testing every 3 years, or they may recommend the Pap and pelvic exam, combined with testing for human papilloma virus (HPV), every 5 years. Some types of HPV increase your risk of cervical cancer. Testing for HPV may also be done on women of any age with unclear Pap test results.  Other health care providers may not recommend any screening for nonpregnant women who are considered low risk for pelvic cancer and who do not have symptoms. Ask your health care provider if a screening pelvic exam is right for you.  If you have had past treatment for cervical cancer or a condition that could lead to cancer, you need Pap tests and screening for cancer for at least 20 years after your treatment. If Pap tests have been discontinued, your risk factors (such as having a new sexual partner) need to be reassessed to determine if screening should resume. Some women have medical problems that increase the chance of getting cervical cancer. In these cases, your health care provider may recommend more frequent screening and Pap tests.  Colorectal Cancer  This type of cancer can be detected and often prevented.  Routine colorectal cancer screening usually begins at 55 years of age and continues through 55 years of age.  Your health care provider may recommend screening at an earlier age if you have risk factors for colon cancer.  Your health care provider may also recommend using home test kits to check  for hidden blood in the stool.  A small camera at the end of a tube can be used to examine your colon directly (sigmoidoscopy or colonoscopy). This is done to check for the earliest forms of colorectal cancer.  Routine screening usually begins at age 58.  Direct examination of the colon should be repeated every 5-10 years through 55 years of age. However, you may need to be screened more often if early forms of precancerous polyps or small growths are found.  Skin Cancer  Check your skin from head to toe regularly.  Tell your health care provider about any new moles or changes in moles, especially if there is a change in a mole's shape or color.  Also tell your health care provider if you have a mole that is larger than the size of a pencil eraser.  Always use sunscreen. Apply sunscreen liberally and repeatedly throughout the day.  Protect yourself by wearing long sleeves, pants, a wide-brimmed hat, and sunglasses  whenever you are outside.  Heart disease, diabetes, and high blood pressure  High blood pressure causes heart disease and increases the risk of stroke. High blood pressure is more likely to develop in: ? People who have blood pressure in the high end of the normal range (130-139/85-89 mm Hg). ? People who are overweight or obese. ? People who are African American.  If you are 80-64 years of age, have your blood pressure checked every 3-5 years. If you are 52 years of age or older, have your blood pressure checked every year. You should have your blood pressure measured twice-once when you are at a hospital or clinic, and once when you are not at a hospital or clinic. Record the average of the two measurements. To check your blood pressure when you are not at a hospital or clinic, you can use: ? An automated blood pressure machine at a pharmacy. ? A home blood pressure monitor.  If you are between 58 years and 58 years old, ask your health care provider if you should take  aspirin to prevent strokes.  Have regular diabetes screenings. This involves taking a blood sample to check your fasting blood sugar level. ? If you are at a normal weight and have a low risk for diabetes, have this test once every three years after 55 years of age. ? If you are overweight and have a high risk for diabetes, consider being tested at a younger age or more often. Preventing infection Hepatitis B  If you have a higher risk for hepatitis B, you should be screened for this virus. You are considered at high risk for hepatitis B if: ? You were born in a country where hepatitis B is common. Ask your health care provider which countries are considered high risk. ? Your parents were born in a high-risk country, and you have not been immunized against hepatitis B (hepatitis B vaccine). ? You have HIV or AIDS. ? You use needles to inject street drugs. ? You live with someone who has hepatitis B. ? You have had sex with someone who has hepatitis B. ? You get hemodialysis treatment. ? You take certain medicines for conditions, including cancer, organ transplantation, and autoimmune conditions.  Hepatitis C  Blood testing is recommended for: ? Everyone born from [birth year] through 1963. ? Anyone with known risk factors for hepatitis C.  Sexually transmitted infections (STIs)  You should be screened for sexually transmitted infections (STIs) including gonorrhea and chlamydia if: ? You are sexually active and are younger than 55 years of age. ? You are older than 55 years of age and your health care provider tells you that you are at risk for this type of infection. ? Your sexual activity has changed since you were last screened and you are at an increased risk for chlamydia or gonorrhea. Ask your health care provider if you are at risk.  If you do not have HIV, but are at risk, it may be recommended that you take a prescription medicine daily to prevent HIV infection. This is called  pre-exposure prophylaxis (PrEP). You are considered at risk if: ? You are sexually active and do not regularly use condoms or know the HIV status of your partner(s). ? You take drugs by injection. ? You are sexually active with a partner who has HIV.  Talk with your health care provider about whether you are at high risk of being infected with HIV. If you choose to begin PrEP, you should  first be tested for HIV. You should then be tested every 3 months for as long as you are taking PrEP. Pregnancy  If you are premenopausal and you may become pregnant, ask your health care provider about preconception counseling.  If you may become pregnant, take 400 to 800 micrograms (mcg) of folic acid every day.  If you want to prevent pregnancy, talk to your health care provider about birth control (contraception). Osteoporosis and menopause  Osteoporosis is a disease in which the bones lose minerals and strength with aging. This can result in serious bone fractures. Your risk for osteoporosis can be identified using a bone density scan.  If you are 64 years of age or older, or if you are at risk for osteoporosis and fractures, ask your health care provider if you should be screened.  Ask your health care provider whether you should take a calcium or vitamin D supplement to lower your risk for osteoporosis.  Menopause may have certain physical symptoms and risks.  Hormone replacement therapy may reduce some of these symptoms and risks. Talk to your health care provider about whether hormone replacement therapy is right for you. Follow these instructions at home:  Schedule regular health, dental, and eye exams.  Stay current with your immunizations.  Do not use any tobacco products including cigarettes, chewing tobacco, or electronic cigarettes.  If you are pregnant, do not drink alcohol.  If you are breastfeeding, limit how much and how often you drink alcohol.  Limit alcohol intake to no more  than 1 drink per day for nonpregnant women. One drink equals 12 ounces of beer, 5 ounces of wine, or 1 ounces of hard liquor.  Do not use street drugs.  Do not share needles.  Ask your health care provider for help if you need support or information about quitting drugs.  Tell your health care provider if you often feel depressed.  Tell your health care provider if you have ever been abused or do not feel safe at home. This information is not intended to replace advice given to you by your health care provider. Make sure you discuss any questions you have with your health care provider. Document Released: 06/06/2011 Document Revised: 04/28/2016 Document Reviewed: 08/25/2015 Elsevier Interactive Patient Education  [employer].

## 2018-05-18 ENCOUNTER — Telehealth: Payer: Self-pay

## 2018-05-18 NOTE — Telephone Encounter (Signed)
PA was initiated on 05/14/2018 via Covermymeds; KEY: [id number]. Received PA denial.   Pt has not had a weight loss of 5 % or greater since last PA request. For insurance coverage- Pt must have had a weight loss of 5% or greater since last PA request to continue.

## 2018-05-20 ENCOUNTER — Encounter: Payer: Self-pay | Admitting: Family Medicine

## 2018-06-10 ENCOUNTER — Encounter: Payer: Self-pay | Admitting: Family Medicine

## 2018-11-21 ENCOUNTER — Encounter: Payer: Self-pay | Admitting: Family Medicine

## 2018-11-21 ENCOUNTER — Other Ambulatory Visit: Payer: Self-pay | Admitting: Family Medicine

## 2018-11-21 DIAGNOSIS — F5101 Primary insomnia: Secondary | ICD-10-CM

## 2018-11-21 DIAGNOSIS — F411 Generalized anxiety disorder: Secondary | ICD-10-CM

## 2018-11-21 NOTE — Telephone Encounter (Signed)
Ok to refill per [place]: 08/28/2018  1   05/14/2018  Clonazepam 1 Mg Tablet  135.00 90 Je Cop  [id number]  [facility] ([id number])  1/1 3.00 LME Comm Ins  [place]  07/27/2018  1   05/14/2018  Phentermine 37.5 Mg Tablet  30.00 30 Je Cop  [id number]  [facility] ([id number])  1/1  Comm Ins  [place]  06/01/2018  1   05/14/2018  Phentermine 37.5 Mg Tablet  30.00 30 Je Cop  [id number]  [facility] ([id number])  0/1  Comm Ins  [place]  05/25/2018  1   05/14/2018  Clonazepam 1 Mg Tablet  135.00 90 Je Cop  [id number]  Wal ([id number])  0/1 3.00 LME Comm Ins  [place]  02/25/2018  1   11/24/2017  Clonazepam 1 Mg Tablet  135.00 90 Je Cop  [id number]  Wal ([id number])  0/0 3.00 LME Comm Ins    11/27/2017  1   11/24/2017  Clonazepam 1 Mg Tablet  135.00 90 Je Cop  [id number]  Wal ([id number])  0/1       Time for visit - message to pt

## 2019-04-25 ENCOUNTER — Encounter: Payer: Self-pay | Admitting: Family Medicine

## 2019-04-29 NOTE — Progress Notes (Signed)
[place] at [facility] [address], [place], [place] [address] [phone]  Date:  05/01/2019   Name:  Melinda Ferrell   DOB:  Feb 19, 1961   MRN:  [id number]  PCP:  Darreld Mclean, MD    Chief Complaint: No chief complaint on file.   History of Present Illness:  Melinda Ferrell is a 58 y.o. very pleasant female patient who presents with the following:  Virtual visit today for a medication check Virtual visit due to pandemic Patient location is home- she is working from home currently. 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   She ran out of her klonopin- would like a refill  Does not feel that she is depressed, just more overwhelmed.  She is not sleeping well  She has an appt at the end of June for a CPE and labs  mammo is UTD  [facility], she is seeing Dr. Charlesetta Ferrell for her pap   11/26/2018  1   11/21/2018  Clonazepam 1 MG Tablet  135.00 90 Je Cop   [id number]   Wal ([id number])   0  3.00 LME  Comm Ins   [place]  08/28/2018  1   05/14/2018  Clonazepam 1 MG Tablet  135.00 90 Je Cop   [id number]   Wal ([id number])   1  3.00 LME  Comm Ins   [place]  07/27/2018  1   05/14/2018  Phentermine 37.5 MG Tablet  30.00 30 Je Cop   [id number]   [place] (1557)   1   Comm Ins   [place]   06/01/2018  1   05/14/2018  Phentermine 37.5 MG Tablet  30.00 30 Je Cop   [id number]   Har (1557)   0   Comm Ins   [place]  05/25/2018  1   05/14/2018  Clonazepam 1 MG Tablet  135.00 90 Je Cop   [id number]   Wal ([id number])   0  3.00 LME  Comm Ins   [place]  02/25/2018  1   11/24/2017  Clonazepam 1 MG Tablet  135.00 90 Je Cop   [id number]   Wal ([id number])   0  3.00 LME        Patient Active Problem List   Diagnosis Date Noted  . Insomnia 05/05/2016  . Dysmenorrhea 11/19/2012    Past Medical History:  Diagnosis Date  . Alcohol abuse    resolved 2012  . Allergy   . Cancer [facility]) 2004   breast  . Clotting disorder [facility])     Past Surgical History:  Procedure Laterality Date  . APPENDECTOMY    . BREAST SURGERY  Social History   Tobacco Use  . Smoking status: Former Smoker    Packs/day: 0.20    Types: Cigarettes    Last attempt to quit: 11/21/2015    Years since quitting: 3.4  . Smokeless tobacco: Never Used  Substance Use Topics  . Alcohol use: No    Alcohol/week: 0.0 standard drinks  . Drug use: No    Family History  Problem Relation Age of Onset  . Hypertension Mother   . Gout Mother   . Diabetes Father   . Hyperlipidemia Father   . Hypertension Father     Allergies  Allergen Reactions  . Sulfa Antibiotics     Medication list has been reviewed and updated.  Current Outpatient Medications on File Prior to Visit  Medication Sig Dispense Refill  . clonazePAM (KLONOPIN) 1 MG tablet TAKE 1/2 TABLET BY MOUTH BY MOUTH DURING THE DAY AND 1 TABLET BY MOUTH AT BEDTIME AS NEEDED 135 tablet 0  . diphenhydrAMINE (BENADRYL) 25 MG tablet Take 25 mg by mouth every 6 (six) hours as needed.    . etonogestrel (IMPLANON) 68 MG IMPL implant Inject 1 each into the skin once.    . IBU 800 MG tablet take 1 tablet by mouth every 8 hours if needed 180 tablet 2  . phentermine (ADIPEX-P) 37.5 MG tablet Take 1 tablet (37.5 mg total) by mouth daily before breakfast. 30 tablet 1   No current  facility-administered medications on file prior to visit.     Review of Systems:  As per HPI- otherwise negative. No fever or illness She is not depressed but does feel overwhelmed  She has not started drinking again, and is not smoking   Physical Examination: There were no vitals filed for this visit. There were no vitals filed for this visit. There is no height or weight on file to calculate BMI. Ideal Body Weight:    Pt observed over video today- she looks well, no cough or distress  She is not checking her BP at home   Assessment and Plan: GAD (generalized anxiety disorder) - Plan: clonazePAM (KLONOPIN) 1 MG tablet  Primary insomnia - Plan: clonazePAM (KLONOPIN) 1 MG tablet  Refilled her baseline clonazepam for anxiety.  Encouraged her to use this as little as possible due to risk of hyperpronation.  I will look forward to seeing her next month for routine physical and labs  Meds ordered this encounter  Medications  . clonazePAM (KLONOPIN) 1 MG tablet    Sig: TAKE 1/2 TABLET BY MOUTH BY MOUTH DURING THE DAY AND 1 TABLET BY MOUTH AT BEDTIME AS NEEDED    Dispense:  135 tablet    Refill:  1     Signed Lamar Blinks, MD

## 2019-04-30 NOTE — Patient Instructions (Signed)
It was great to visit with you today

## 2019-05-01 ENCOUNTER — Encounter: Payer: Self-pay | Admitting: Family Medicine

## 2019-05-01 ENCOUNTER — Ambulatory Visit (INDEPENDENT_AMBULATORY_CARE_PROVIDER_SITE_OTHER): Payer: 59 | Admitting: Family Medicine

## 2019-05-01 ENCOUNTER — Other Ambulatory Visit: Payer: Self-pay

## 2019-05-01 DIAGNOSIS — F5101 Primary insomnia: Secondary | ICD-10-CM

## 2019-05-01 DIAGNOSIS — F411 Generalized anxiety disorder: Secondary | ICD-10-CM

## 2019-05-01 MED ORDER — CLONAZEPAM 1 MG PO TABS: ORAL_TABLET | ORAL | 1 refills | Status: DC

## 2019-06-02 NOTE — Progress Notes (Addendum)
[place] at [facility] [address] [place], [place] [address] [phone]  Date:  06/03/2019   Name:  Melinda Ferrell   DOB:  [birth year]   MRN:  [id number]  PCP:  Darreld Mclean, MD    Chief Complaint: No chief complaint on file.   History of Present Illness:  Melinda Ferrell is a 58 y.o. very pleasant female patient who presents with the following:  Here today for a CPE- we did a virtual visit last month  Generally in good health, history of breast cancer 2004, blood clotting disorder and insomnia She uses clonazepam for her insomnia- this does help her.  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  She hopes to continue working from home long term if she can   Pap: per GYN, Dr. Charlesetta Garibaldi They will remove her nexplanon in July - she does not need to have a new one placed  Immun: suggest shingrix- she would like to start today. Otherwise UD Colon:UTD Mammo: will be done in August  Labs:a year ago - she is fasting  Admits that she is not exercising much right now- she will try to do better, she is out of her routine right now Former smoker No recent illness or other concerns about symptoms     Patient Active Problem List   Diagnosis Date Noted  . Insomnia 05/05/2016  . Dysmenorrhea 11/19/2012    Past Medical History:  Diagnosis Date  . Alcohol abuse    resolved 2012  . Allergy   . Cancer [facility]) 2004   breast  . Clotting disorder [facility])     Past Surgical History:  Procedure Laterality Date  . APPENDECTOMY    . BREAST SURGERY      Social History   Tobacco Use  . Smoking status: Former Smoker    Packs/day: 0.20    Types: Cigarettes    Quit date: 11/21/2015    Years since quitting: 3.5  . Smokeless tobacco: Never Used  Substance Use Topics  . Alcohol use: No    Alcohol/week: 0.0  standard drinks  . Drug use: No    Family History  Problem Relation Age of Onset  . Hypertension Mother   . Gout Mother   . Diabetes Father   . Hyperlipidemia Father   . Hypertension Father     Allergies  Allergen Reactions  . Sulfa Antibiotics     Medication list has been reviewed and updated.  Current Outpatient Medications on File Prior to Visit  Medication Sig Dispense Refill  . clonazePAM (KLONOPIN) 1 MG tablet TAKE 1/2 TABLET BY MOUTH BY MOUTH DURING THE DAY AND 1 TABLET BY MOUTH AT BEDTIME AS NEEDED 135 tablet 1  . diphenhydrAMINE (BENADRYL) 25 MG tablet Take 25 mg by mouth every 6 (six) hours as needed.    . etonogestrel (IMPLANON) 68 MG IMPL implant Inject 1 each into the skin once.    . IBU 800 MG tablet take 1 tablet by mouth every 8 hours if needed 180 tablet 2   No current facility-administered medications on file prior to visit.     Review of Systems:  As per HPI- otherwise negative. No fever or chills No CP or SOB   Physical Examination: Vitals:   06/03/19 1526  BP: 112/80  Pulse: 75  Temp: 98.3 F (36.8 C)  SpO2: 99%   There were no vitals filed for this visit. There is no height or weight on file to calculate BMI. Ideal Body Weight:    GEN: WDWN, NAD, Non-toxic, A & O x 3, overweight, looks well  HEENT: Atraumatic, Normocephalic. Neck supple. No masses, No LAD.  TM wnl, PEERL Ears and Nose: No external deformity. CV: RRR, No M/G/R. No JVD. No thrill. No extra heart sounds. PULM: CTA B, no wheezes, crackles, rhonchi. No retractions. No resp. distress. No accessory muscle use. ABD: S, NT, ND, +BS. No rebound. No HSM.  Benign belly EXTR: No c/c/e NEURO Normal gait.  PSYCH: Normally interactive. Conversant. Not depressed or anxious appearing.  Calm demeanor.    Assessment and Plan:   ICD-10-CM   1. Physical exam  Z00.00   2. GAD (generalized anxiety disorder)  F41.1   3. Dysmenorrhea  N94.6   4. Screening for diabetes mellitus  Z13.1  Comprehensive metabolic panel    Hemoglobin A1c  5. Screening for deficiency anemia  Z13.0 CBC  6. Screening for hyperlipidemia  Z13.220 Lipid panel  7. Immunization due  Z23 Varicella-zoster vaccine IM (Shingrix)     Follow-up: No follow-ups on file.  No orders of the defined types were placed in this encounter.  Orders Placed This Encounter  Procedures  . Varicella-zoster vaccine IM (Shingrix)  . CBC  . Comprehensive metabolic panel  . Hemoglobin A1c  . Lipid panel    @SIGN @  Outpatient Encounter Medications as of 06/03/2019  Medication Sig  . clonazePAM (KLONOPIN) 1 MG tablet TAKE 1/2 TABLET BY MOUTH BY MOUTH DURING THE DAY AND 1 TABLET BY MOUTH AT BEDTIME AS NEEDED  . diphenhydrAMINE (BENADRYL) 25 MG tablet Take 25 mg by mouth every 6 (six) hours as needed.  . etonogestrel (IMPLANON) 68 MG IMPL implant Inject 1 each into the skin once.  . IBU 800 MG tablet take 1 tablet by mouth every 8 hours if needed   No facility-administered encounter medications on file as of 06/03/2019.    Physical exam today- she is doing fine, needs to exercise more which I encouraged Start shingrix today History of breast cancer, she does routine mammo per her GYN Clonazepam for sleep Will plan further follow- up pending labs.   Signed Lamar Blinks, MD  Addendum 6/30, received her labs Message to patient  Anemia is new-colonoscopy done last year To monitor Potassium low-no medication cause noted  Results for orders placed or performed in visit on 06/03/19  CBC  Result Value Ref Range   WBC 5.4 4.0 - 10.5 K/uL   RBC 3.68 (L) 3.87 - 5.11 Mil/uL   Platelets 347.0 150.0 - 400.0 K/uL   Hemoglobin 11.4 (L) 12.0 - 15.0 g/dL   HCT 34.8 (L) 36.0 - 46.0 %   MCV 94.6 78.0 - 100.0 fl   MCHC 32.8 30.0 - 36.0 g/dL   RDW 14.1 11.5 - 15.5 %  Comprehensive metabolic panel  Result Value Ref Range   Sodium 139 135 - 145 mEq/L   Potassium 3.2 (L) 3.5 - 5.1 mEq/L   Chloride 105 96 - 112 mEq/L    CO2 22 19 - 32 mEq/L   Glucose, Bld 80 70 - 99 mg/dL   BUN 12 6 - 23 mg/dL   Creatinine, Ser 0.75 0.40 - 1.20 mg/dL   Total Bilirubin 0.7 0.2 - 1.2 mg/dL   Alkaline Phosphatase 69 39 - 117 U/L  AST 26 0 - 37 U/L   ALT 28 0 - 35 U/L   Total Protein 6.9 6.0 - 8.3 g/dL   Albumin 3.8 3.5 - 5.2 g/dL   Calcium 8.8 8.4 - 10.5 mg/dL   GFR 96.70 >60.00 mL/min  Hemoglobin A1c  Result Value Ref Range   Hgb A1c MFr Bld 5.1 4.6 - 6.5 %  Lipid panel  Result Value Ref Range   Cholesterol 150 0 - 200 mg/dL   Triglycerides 179.0 (H) 0.0 - 149.0 mg/dL   HDL 51.20 >39.00 mg/dL   VLDL 35.8 0.0 - 40.0 mg/dL   LDL Cholesterol 63 0 - 99 mg/dL   Total CHOL/HDL Ratio 3    NonHDL 98.55

## 2019-06-03 ENCOUNTER — Encounter: Payer: 59 | Admitting: Family Medicine

## 2019-06-03 ENCOUNTER — Encounter: Payer: Self-pay | Admitting: Family Medicine

## 2019-06-03 ENCOUNTER — Ambulatory Visit (INDEPENDENT_AMBULATORY_CARE_PROVIDER_SITE_OTHER): Payer: 59 | Admitting: Family Medicine

## 2019-06-03 ENCOUNTER — Other Ambulatory Visit: Payer: Self-pay

## 2019-06-03 VITALS — BP 112/80 | HR 75 | Temp 98.3°F | Wt 183.0 lb

## 2019-06-03 DIAGNOSIS — E876 Hypokalemia: Secondary | ICD-10-CM

## 2019-06-03 DIAGNOSIS — F411 Generalized anxiety disorder: Secondary | ICD-10-CM | POA: Diagnosis not present

## 2019-06-03 DIAGNOSIS — Z131 Encounter for screening for diabetes mellitus: Secondary | ICD-10-CM

## 2019-06-03 DIAGNOSIS — N946 Dysmenorrhea, unspecified: Secondary | ICD-10-CM | POA: Diagnosis not present

## 2019-06-03 DIAGNOSIS — Z130 Encounter for screening for diseases of the blood and blood-forming organs and certain disorders involving the immune mechanism: Secondary | ICD-10-CM | POA: Diagnosis not present

## 2019-06-03 DIAGNOSIS — Z0000 Encounter for general adult medical examination without abnormal findings: Secondary | ICD-10-CM

## 2019-06-03 DIAGNOSIS — Z23 Encounter for immunization: Secondary | ICD-10-CM | POA: Diagnosis not present

## 2019-06-03 DIAGNOSIS — Z13220 Encounter for screening for lipoid disorders: Secondary | ICD-10-CM

## 2019-06-03 DIAGNOSIS — D649 Anemia, unspecified: Secondary | ICD-10-CM

## 2019-06-03 NOTE — Patient Instructions (Addendum)
Great to see you today as always!  Take care and I will be in touch with your labs asap You got your first shingrix today- please see [place] in 2-6 months for your next shot  Please do work on getting more exercise- walking, lifting light weight at home, or doing exercise videos are all good options!    Health Maintenance, Female Adopting a healthy lifestyle and getting preventive care are important in promoting health and wellness. Ask your health care provider about:  The right schedule for you to have regular tests and exams.  Things you can do on your own to prevent diseases and keep yourself healthy. What should I know about diet, weight, and exercise? Eat a healthy diet   Eat a diet that includes plenty of vegetables, fruits, low-fat dairy products, and lean protein.  Do not eat a lot of foods that are high in solid fats, added sugars, or sodium. Maintain a healthy weight Body mass index (BMI) is used to identify weight problems. It estimates body fat based on height and weight. Your health care provider can help determine your BMI and help you achieve or maintain a healthy weight. Get regular exercise Get regular exercise. This is one of the most important things you can do for your health. Most adults should:  Exercise for at least 150 minutes each week. The exercise should increase your heart rate and make you sweat (moderate-intensity exercise).  Do strengthening exercises at least twice a week. This is in addition to the moderate-intensity exercise.  Spend less time sitting. Even light physical activity can be beneficial. Watch cholesterol and blood lipids Have your blood tested for lipids and cholesterol at 56 years of age, then have this test every 5 years. Have your cholesterol levels checked more often if:  Your lipid or cholesterol levels are high.  You are older than 56 years of age.  You are at high risk for heart disease. What should I know about cancer  screening? Depending on your health history and family history, you may need to have cancer screening at various ages. This may include screening for:  Breast cancer.  Cervical cancer.  Colorectal cancer.  Skin cancer.  Lung cancer. What should I know about heart disease, diabetes, and high blood pressure? Blood pressure and heart disease  High blood pressure causes heart disease and increases the risk of stroke. This is more likely to develop in people who have high blood pressure readings, are of African descent, or are overweight.  Have your blood pressure checked: ? Every 3-5 years if you are 23-2 years of age. ? Every year if you are 78 years old or older. Diabetes Have regular diabetes screenings. This checks your fasting blood sugar level. Have the screening done:  Once every three years after age 84 if you are at a normal weight and have a low risk for diabetes.  More often and at a younger age if you are overweight or have a high risk for diabetes. What should I know about preventing infection? Hepatitis B If you have a higher risk for hepatitis B, you should be screened for this virus. Talk with your health care provider to find out if you are at risk for hepatitis B infection. Hepatitis C Testing is recommended for:  Everyone born from [birth year] through 1963.  Anyone with known risk factors for hepatitis C. Sexually transmitted infections (STIs)  Get screened for STIs, including gonorrhea and chlamydia, if: ? You are sexually active  and are younger than 56 years of age. ? You are older than 56 years of age and your health care provider tells you that you are at risk for this type of infection. ? Your sexual activity has changed since you were last screened, and you are at increased risk for chlamydia or gonorrhea. Ask your health care provider if you are at risk.  Ask your health care provider about whether you are at high risk for HIV. Your health care provider may  recommend a prescription medicine to help prevent HIV infection. If you choose to take medicine to prevent HIV, you should first get tested for HIV. You should then be tested every 3 months for as long as you are taking the medicine. Pregnancy  If you are about to stop having your period (premenopausal) and you may become pregnant, seek counseling before you get pregnant.  Take 400 to 800 micrograms (mcg) of folic acid every day if you become pregnant.  Ask for birth control (contraception) if you want to prevent pregnancy. Osteoporosis and menopause Osteoporosis is a disease in which the bones lose minerals and strength with aging. This can result in bone fractures. If you are 65 years old or older, or if you are at risk for osteoporosis and fractures, ask your health care provider if you should:  Be screened for bone loss.  Take a calcium or vitamin D supplement to lower your risk of fractures.  Be given hormone replacement therapy (HRT) to treat symptoms of menopause. Follow these instructions at home: Lifestyle  Do not use any products that contain nicotine or tobacco, such as cigarettes, e-cigarettes, and chewing tobacco. If you need help quitting, ask your health care provider.  Do not use street drugs.  Do not share needles.  Ask your health care provider for help if you need support or information about quitting drugs. Alcohol use  Do not drink alcohol if: ? Your health care provider tells you not to drink. ? You are pregnant, may be pregnant, or are planning to become pregnant.  If you drink alcohol: ? Limit how much you use to 0-1 drink a day. ? Limit intake if you are breastfeeding.  Be aware of how much alcohol is in your drink. In the [place] one drink equals one 12 oz bottle of beer (355 mL), one 5 oz glass of wine (148 mL), or one 1 oz glass of hard liquor (44 mL). General instructions  Schedule regular health, dental, and eye exams.  Stay current with your  vaccines.  Tell your health care provider if: ? You often feel depressed. ? You have ever been abused or do not feel safe at home. Summary  Adopting a healthy lifestyle and getting preventive care are important in promoting health and wellness.  Follow your health care provider's instructions about healthy diet, exercising, and getting tested or screened for diseases.  Follow your health care provider's instructions on monitoring your cholesterol and blood pressure. This information is not intended to replace advice given to you by your health care provider. Make sure you discuss any questions you have with your health care provider. Document Released: 06/06/2011 Document Revised: 11/14/2018 Document Reviewed: 11/14/2018 Elsevier Patient Education  2020 [employer].

## 2019-06-04 ENCOUNTER — Encounter: Payer: Self-pay | Admitting: Family Medicine

## 2019-06-04 LAB — CBC
HCT: 34.8 % — ABNORMAL LOW (ref 36.0–46.0)
Hemoglobin: 11.4 g/dL — ABNORMAL LOW (ref 12.0–15.0)
MCHC: 32.8 g/dL (ref 30.0–36.0)
MCV: 94.6 fl (ref 78.0–100.0)
Platelets: 347 10*3/uL (ref 150.0–400.0)
RBC: 3.68 Mil/uL — ABNORMAL LOW (ref 3.87–5.11)
RDW: 14.1 % (ref 11.5–15.5)
WBC: 5.4 10*3/uL (ref 4.0–10.5)

## 2019-06-04 LAB — COMPREHENSIVE METABOLIC PANEL
ALT: 28 U/L (ref 0–35)
AST: 26 U/L (ref 0–37)
Albumin: 3.8 g/dL (ref 3.5–5.2)
Alkaline Phosphatase: 69 U/L (ref 39–117)
BUN: 12 mg/dL (ref 6–23)
CO2: 22 mEq/L (ref 19–32)
Calcium: 8.8 mg/dL (ref 8.4–10.5)
Chloride: 105 mEq/L (ref 96–112)
Creatinine, Ser: 0.75 mg/dL (ref 0.40–1.20)
GFR: 96.7 mL/min (ref >60.00)
Glucose, Bld: 80 mg/dL (ref 70–99)
Potassium: 3.2 mEq/L — ABNORMAL LOW (ref 3.5–5.1)
Sodium: 139 mEq/L (ref 135–145)
Total Bilirubin: 0.7 mg/dL (ref 0.2–1.2)
Total Protein: 6.9 g/dL (ref 6.0–8.3)

## 2019-06-04 LAB — LIPID PANEL
Cholesterol: 150 mg/dL (ref 0–200)
HDL: 51.2 mg/dL (ref >39.00)
LDL Cholesterol: 63 mg/dL (ref 0–99)
NonHDL: 98.55
Total CHOL/HDL Ratio: 3
Triglycerides: 179 mg/dL — ABNORMAL HIGH (ref 0.0–149.0)
VLDL: 35.8 mg/dL (ref 0.0–40.0)

## 2019-06-04 LAB — HEMOGLOBIN A1C: Hgb A1c MFr Bld: 5.1 % (ref 4.6–6.5)

## 2019-06-04 NOTE — Addendum Note (Signed)
Addended by: Lamar Blinks C on: 06/04/2019 04:14 PM   Modules accepted: Orders

## 2019-08-01 ENCOUNTER — Encounter: Payer: Self-pay | Admitting: *Deleted

## 2019-11-05 ENCOUNTER — Other Ambulatory Visit: Payer: Self-pay | Admitting: Family Medicine

## 2019-11-05 DIAGNOSIS — F411 Generalized anxiety disorder: Secondary | ICD-10-CM

## 2019-11-05 DIAGNOSIS — F5101 Primary insomnia: Secondary | ICD-10-CM

## 2019-11-05 MED ORDER — CLONAZEPAM 1 MG PO TABS: ORAL_TABLET | ORAL | 0 refills | Status: DC

## 2019-11-05 NOTE — Telephone Encounter (Signed)
Copied from [place] [id number]. Topic: Quick Communication - Rx Refill/Question >> Nov 05, 2019 10:41 AM Rainey Pines A wrote: Medication: clonazePAM (KLONOPIN) 1 MG tablet (90 day supply and 1 refill) (Patient stated that  the pharmacy has tried reaching out to office to get medication sent back over since 10/30/2019.)  Has the patient contacted their pharmacy? Yes (Agent: If no, request that the patient contact the pharmacy for the refill.) (Agent: If yes, when and what did the pharmacy advise?)Contact PCP  Preferred Pharmacy (with phone number or street name):[facility] [phone] - [place], [address] AT [facility] [phone] (Phone) [phone] (Fax)    Agent: Please be advised that RX refills may take up to 3 business days. We ask that you follow-up with your pharmacy.

## 2019-11-05 NOTE — Telephone Encounter (Signed)
Requested medication (s) are due for refill today: yes  Requested medication (s) are on the active medication list: yes  Last refill:  05/01/2019  Future visit scheduled: no  Notes to clinic:  Refill cannot be delegated    Requested Prescriptions  Pending Prescriptions Disp Refills   clonazePAM (KLONOPIN) 1 MG tablet 135 tablet 1    Sig: TAKE 1/2 TABLET BY MOUTH BY MOUTH DURING THE DAY AND 1 TABLET BY MOUTH AT BEDTIME AS NEEDED     Not Delegated - Psychiatry:  Anxiolytics/Hypnotics Failed - 11/05/2019 10:46 AM      Failed - This refill cannot be delegated      Failed - Urine Drug Screen completed in last 360 days.      Passed - Valid encounter within last 6 months    Recent Outpatient Visits          5 months ago Physical exam   [facility] at [place], MD   6 months ago GAD (generalized anxiety disorder)   [facility] at [facility], Gay Filler, MD   1 year ago Physical exam   [facility] at [place], Gay Filler, MD   2 years ago Physical exam   [facility] at [place], Gay Filler, MD   3 years ago Physical exam   [facility] at [place], Gay Filler, MD

## 2019-11-05 NOTE — Telephone Encounter (Signed)
Requesting: Klonopin Contract: 05/05/2016 UDS: 05/05/2016 Last OV: 06/03/2019 Next OV: N/A Last Refill: 05/01/2019, #135--1 RF Database:   Please advise

## 2020-02-03 ENCOUNTER — Encounter: Payer: Self-pay | Admitting: Family Medicine

## 2020-02-03 ENCOUNTER — Other Ambulatory Visit: Payer: Self-pay | Admitting: Family Medicine

## 2020-02-03 DIAGNOSIS — F411 Generalized anxiety disorder: Secondary | ICD-10-CM

## 2020-02-03 DIAGNOSIS — F5101 Primary insomnia: Secondary | ICD-10-CM

## 2020-02-04 MED ORDER — CLONAZEPAM 1 MG PO TABS: ORAL_TABLET | ORAL | 0 refills | Status: DC

## 2020-05-28 ENCOUNTER — Encounter: Payer: 59 | Admitting: Family Medicine

## 2020-06-03 NOTE — Patient Instructions (Addendum)
It was great to see you again today, I will be in touch your labs as soon as possible We refilled your clonazepam for you to use as needed for sleep Let's also start you on prozac for anxiety.  Start wiht 20 mg; take 1 tablet daily.  After 2 weeks you can increase to 40 mg once a day  Send me a mychart message in 3-4 weeks and let me know how this is working for you- sooner if it does not agree with you  Health Maintenance, Female Adopting a healthy lifestyle and getting preventive care are important in promoting health and wellness. Ask your health care provider about:  The right schedule for you to have regular tests and exams.  Things you can do on your own to prevent diseases and keep yourself healthy. What should I know about diet, weight, and exercise? Eat a healthy diet   Eat a diet that includes plenty of vegetables, fruits, low-fat dairy products, and lean protein.  Do not eat a lot of foods that are high in solid fats, added sugars, or sodium. Maintain a healthy weight Body mass index (BMI) is used to identify weight problems. It estimates body fat based on height and weight. Your health care provider can help determine your BMI and help you achieve or maintain a healthy weight. Get regular exercise Get regular exercise. This is one of the most important things you can do for your health. Most adults should:  Exercise for at least 150 minutes each week. The exercise should increase your heart rate and make you sweat (moderate-intensity exercise).  Do strengthening exercises at least twice a week. This is in addition to the moderate-intensity exercise.  Spend less time sitting. Even light physical activity can be beneficial. Watch cholesterol and blood lipids Have your blood tested for lipids and cholesterol at 57 years of age, then have this test every 5 years. Have your cholesterol levels checked more often if:  Your lipid or cholesterol levels are high.  You are older  than 57 years of age.  You are at high risk for heart disease. What should I know about cancer screening? Depending on your health history and family history, you may need to have cancer screening at various ages. This may include screening for:  Breast cancer.  Cervical cancer.  Colorectal cancer.  Skin cancer.  Lung cancer. What should I know about heart disease, diabetes, and high blood pressure? Blood pressure and heart disease  High blood pressure causes heart disease and increases the risk of stroke. This is more likely to develop in people who have high blood pressure readings, are of African descent, or are overweight.  Have your blood pressure checked: ? Every 3-5 years if you are 32-24 years of age. ? Every year if you are 81 years old or older. Diabetes Have regular diabetes screenings. This checks your fasting blood sugar level. Have the screening done:  Once every three years after age 76 if you are at a normal weight and have a low risk for diabetes.  More often and at a younger age if you are overweight or have a high risk for diabetes. What should I know about preventing infection? Hepatitis B If you have a higher risk for hepatitis B, you should be screened for this virus. Talk with your health care provider to find out if you are at risk for hepatitis B infection. Hepatitis C Testing is recommended for:  Everyone born from [birth year] through 1963.  Anyone with known risk factors for hepatitis C. Sexually transmitted infections (STIs)  Get screened for STIs, including gonorrhea and chlamydia, if: ? You are sexually active and are younger than 57 years of age. ? You are older than 57 years of age and your health care provider tells you that you are at risk for this type of infection. ? Your sexual activity has changed since you were last screened, and you are at increased risk for chlamydia or gonorrhea. Ask your health care provider if you are at risk.  Ask  your health care provider about whether you are at high risk for HIV. Your health care provider may recommend a prescription medicine to help prevent HIV infection. If you choose to take medicine to prevent HIV, you should first get tested for HIV. You should then be tested every 3 months for as long as you are taking the medicine. Pregnancy  If you are about to stop having your period (premenopausal) and you may become pregnant, seek counseling before you get pregnant.  Take 400 to 800 micrograms (mcg) of folic acid every day if you become pregnant.  Ask for birth control (contraception) if you want to prevent pregnancy. Osteoporosis and menopause Osteoporosis is a disease in which the bones lose minerals and strength with aging. This can result in bone fractures. If you are 42 years old or older, or if you are at risk for osteoporosis and fractures, ask your health care provider if you should:  Be screened for bone loss.  Take a calcium or vitamin D supplement to lower your risk of fractures.  Be given hormone replacement therapy (HRT) to treat symptoms of menopause. Follow these instructions at home: Lifestyle  Do not use any products that contain nicotine or tobacco, such as cigarettes, e-cigarettes, and chewing tobacco. If you need help quitting, ask your health care provider.  Do not use street drugs.  Do not share needles.  Ask your health care provider for help if you need support or information about quitting drugs. Alcohol use  Do not drink alcohol if: ? Your health care provider tells you not to drink. ? You are pregnant, may be pregnant, or are planning to become pregnant.  If you drink alcohol: ? Limit how much you use to 0-1 drink a day. ? Limit intake if you are breastfeeding.  Be aware of how much alcohol is in your drink. In the [place] one drink equals one 12 oz bottle of beer (355 mL), one 5 oz glass of wine (148 mL), or one 1 oz glass of hard liquor (44  mL). General instructions  Schedule regular health, dental, and eye exams.  Stay current with your vaccines.  Tell your health care provider if: ? You often feel depressed. ? You have ever been abused or do not feel safe at home. Summary  Adopting a healthy lifestyle and getting preventive care are important in promoting health and wellness.  Follow your health care provider's instructions about healthy diet, exercising, and getting tested or screened for diseases.  Follow your health care provider's instructions on monitoring your cholesterol and blood pressure. This information is not intended to replace advice given to you by your health care provider. Make sure you discuss any questions you have with your health care provider. Document Revised: 11/14/2018 Document Reviewed: 11/14/2018 Elsevier Patient Education  2020 [employer].

## 2020-06-03 NOTE — Progress Notes (Addendum)
[facility] [address] [place], [place] [address] [phone]  Date:  06/04/2020   Name:  Melinda Ferrell   DOB:  [birth year]   MRN:  [id number]  PCP:  Darreld Mclean, MD    Chief Complaint: Annual Exam   History of Present Illness:  Melinda Ferrell is a 59 y.o. very pleasant female patient who presents with the following:  Patient with history of insomnia, dysmenorrhea, blood clotting disorder.  Prior history of breast cancer 2004 and alcohol abuse Here today for a physical exam Last seen by myself in June 2020  She is clonazepam as needed for insomnia- she uses this most nights.  She notes that she does need a refill She does tend to have some anxiety at baseline She does not feel depressed.   Just feels like she has a hard time relaxing, she tends to have a lot of worries that keep her awake at night.  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  She is working from home right now, she is currently interviewing for promotion  She is a patient of Dr. Charlesetta Garibaldi for GYN care, she had nexplanon but now removed   COVID-19 series- done  Pap smear- per GYN Mammogram- per GYN Colon cancer screening up-to-date Shingrix complete Needs routine labs today  She is getting some exercise She has changed her diet a bit She has lost a couple pounds and is pleased.  She is actually having a plastic surgery consultation soon to discuss possible liposuction and tummy tuck procedure  Wt Readings from Last 3 Encounters:  06/04/20 182 lb (82.6 kg)  06/03/19 183 lb (83 kg)  05/14/18 179 lb (81.2 kg)     02/04/2020  1   02/04/2020  Clonazepam 1 MG Tablet  135.00  90 Je Cop   [id number]   Wal ([id number])   0  3.00 LME  Comm Ins   [place]  11/05/2019  1   11/05/2019  Clonazepam 1 MG Tablet  135.00  90 Je Cop   [id number]    Wal ([id number])   0  3.00 LME  Comm Ins   [place]  07/30/2019  1   05/01/2019  Clonazepam 1 MG Tablet  135.00  90 Je Cop   [id number]   Wal ([id number])   1  3.00 LME  Comm Ins   [place]  05/01/2019  1   05/01/2019  Clonazepam 1 MG Tablet  135.00  90 Je Cop   [id number]   Wal ([id number])   0  3.00 LME  Comm Ins   [place]  11/26/2018  1   11/21/2018  Clonazepam 1 MG Tablet  135.00  90 Je Cop   [id number]   Wal ([id number])   0  3.00 LME       Patient Active Problem List   Diagnosis Date Noted  . Insomnia 05/05/2016  . Dysmenorrhea 11/19/2012    Past Medical History:  Diagnosis Date  . Alcohol abuse    resolved 2012  . Allergy   . Cancer [facility]) 2004   breast  . Clotting disorder [facility])     Past Surgical History:  Procedure Laterality Date  . APPENDECTOMY    . BREAST SURGERY      Social History   Tobacco Use  .  Smoking status: Former Smoker    Packs/day: 0.20    Types: Cigarettes    Quit date: 11/21/2015    Years since quitting: 4.5  . Smokeless tobacco: Never Used  Substance Use Topics  . Alcohol use: No    Alcohol/week: 0.0 standard drinks  . Drug use: No    Family History  Problem Relation Age of Onset  . Hypertension Mother   . Gout Mother   . Diabetes Father   . Hyperlipidemia Father   . Hypertension Father     Allergies  Allergen Reactions  . Sulfa Antibiotics     Medication list has been reviewed and updated.  Current Outpatient Medications on File Prior to Visit  Medication Sig Dispense Refill  . clonazePAM (KLONOPIN) 1 MG tablet TAKE 1/2 TABLET BY MOUTH BY MOUTH DURING THE DAY AND 1 TABLET BY MOUTH AT BEDTIME AS NEEDED 135 tablet 0  . diphenhydrAMINE (BENADRYL) 25 MG tablet Take 25 mg by mouth every 6 (six) hours as needed.    . etonogestrel (IMPLANON) 68 MG IMPL implant Inject 1 each into the skin once.     No current facility-administered medications on file prior to visit.    Review of Systems:  As per HPI- otherwise negative. No fever or chills  Physical Examination: Vitals:    06/04/20 1358  BP: 116/70  Pulse: 87  Resp: 17  Temp: 98.4 F (36.9 C)  SpO2: 98%   Vitals:   06/04/20 1358  Weight: 182 lb (82.6 kg)  Height: 5\' 2"  (1.575 m)   Body mass index is 33.29 kg/m. Ideal Body Weight: Weight in (lb) to have BMI = 25: 136.4  GEN: no acute distress.  Obese, otherwise looks well and her normal self HEENT: Atraumatic, Normocephalic.   Bilateral TM wnl, oropharynx normal.  PEERL,EOMI.   Ears and Nose: No external deformity. CV: RRR, No M/G/R. No JVD. No thrill. No extra heart sounds. PULM: CTA B, no wheezes, crackles, rhonchi. No retractions. No resp. distress. No accessory muscle use. ABD: S, NT, ND, +BS. No rebound. No HSM. EXTR: No c/c/e PSYCH: Normally interactive. Conversant.    Assessment and Plan: Physical exam  Screening for diabetes mellitus - Plan: Comprehensive metabolic panel, Hemoglobin A1c  GAD (generalized anxiety disorder) - Plan: FLUoxetine (PROZAC) 20 MG capsule, clonazePAM (KLONOPIN) 1 MG tablet  Screening for hyperlipidemia - Plan: Lipid panel  Primary insomnia - Plan: clonazePAM (KLONOPIN) 1 MG tablet  Screening for deficiency anemia - Plan: CBC  Screening for thyroid disorder - Plan: CANCELED: TSH  Here today for physical exam Routine labs are pending as above Reviewed health maintenance-Pap and mammogram are per her GYN provider We discussed her anxiety, she wonders about changing to a different benzodiazepine.  I suggested that instead we try adding an SSRI to her daily routine in hopes of decreasing her baseline anxiety.  After discussion she would like to give this a try.  Prescribed fluoxetine 20 mg, she may increase to 40 mg after 2 weeks.  I have asked her to let me know how this medication works for her Encouraged continued exercise and dietary modification Will plan further follow- up pending labs.  This visit occurred during the SARS-CoV-2 public health emergency.  Safety protocols were in place, including  screening questions prior to the visit, additional usage of staff PPE, and extensive cleaning of exam room while observing appropriate contact time as indicated for disinfecting solutions.    Signed Lamar Blinks, MD  addnd 7/3- received her  labs as below Message to pt  Results for orders placed or performed in visit on 06/04/20  CBC  Result Value Ref Range   WBC 4.8 4.0 - 10.5 K/uL   RBC 3.92 3.87 - 5.11 Mil/uL   Platelets 315.0 150 - 400 K/uL   Hemoglobin 11.9 (L) 12.0 - 15.0 g/dL   HCT 36.8 36 - 46 %   MCV 93.7 78.0 - 100.0 fl   MCHC 32.4 30.0 - 36.0 g/dL   RDW 13.9 11.5 - 15.5 %  Comprehensive metabolic panel  Result Value Ref Range   Sodium 138 135 - 145 mEq/L   Potassium 4.2 3.5 - 5.1 mEq/L   Chloride 102 96 - 112 mEq/L   CO2 26 19 - 32 mEq/L   Glucose, Bld 88 70 - 99 mg/dL   BUN 12 6 - 23 mg/dL   Creatinine, Ser 0.76 0.40 - 1.20 mg/dL   Total Bilirubin 0.4 0.2 - 1.2 mg/dL   Alkaline Phosphatase 81 39 - 117 U/L   AST 25 0 - 37 U/L   ALT 26 0 - 35 U/L   Total Protein 7.2 6.0 - 8.3 g/dL   Albumin 4.3 3.5 - 5.2 g/dL   GFR 94.89 >60.00 mL/min   Calcium 9.7 8.4 - 10.5 mg/dL  Hemoglobin A1c  Result Value Ref Range   Hgb A1c MFr Bld 5.2 4.6 - 6.5 %  Lipid panel  Result Value Ref Range   Cholesterol 232 (H) 0 - 200 mg/dL   Triglycerides 152.0 (H) 0 - 149 mg/dL   HDL 42.40 >39.00 mg/dL   VLDL 30.4 0.0 - 40.0 mg/dL   LDL Cholesterol 159 (H) 0 - 99 mg/dL   Total CHOL/HDL Ratio 5    NonHDL 189.56    The 10-year ASCVD risk score Mikey Bussing DC Jr., et al., 2013) is: 4.1%   Values used to calculate the score:     Age: 38 years     Sex: Female     Is Non-Hispanic African American: Yes     Diabetic: No     Tobacco smoker: No     Systolic Blood Pressure: 459 mmHg     Is BP treated: No     HDL Cholesterol: 42.4 mg/dL     Total Cholesterol: 232 mg/dL

## 2020-06-04 ENCOUNTER — Other Ambulatory Visit: Payer: Self-pay

## 2020-06-04 ENCOUNTER — Ambulatory Visit (INDEPENDENT_AMBULATORY_CARE_PROVIDER_SITE_OTHER): Payer: 59 | Admitting: Family Medicine

## 2020-06-04 ENCOUNTER — Encounter: Payer: Self-pay | Admitting: Family Medicine

## 2020-06-04 VITALS — BP 116/70 | HR 87 | Temp 98.4°F | Resp 17 | Ht 62.0 in | Wt 182.0 lb

## 2020-06-04 DIAGNOSIS — Z131 Encounter for screening for diabetes mellitus: Secondary | ICD-10-CM

## 2020-06-04 DIAGNOSIS — F411 Generalized anxiety disorder: Secondary | ICD-10-CM | POA: Diagnosis not present

## 2020-06-04 DIAGNOSIS — Z0000 Encounter for general adult medical examination without abnormal findings: Secondary | ICD-10-CM

## 2020-06-04 DIAGNOSIS — Z13220 Encounter for screening for lipoid disorders: Secondary | ICD-10-CM | POA: Diagnosis not present

## 2020-06-04 DIAGNOSIS — Z1329 Encounter for screening for other suspected endocrine disorder: Secondary | ICD-10-CM

## 2020-06-04 DIAGNOSIS — Z130 Encounter for screening for diseases of the blood and blood-forming organs and certain disorders involving the immune mechanism: Secondary | ICD-10-CM | POA: Diagnosis not present

## 2020-06-04 DIAGNOSIS — F5101 Primary insomnia: Secondary | ICD-10-CM

## 2020-06-04 MED ORDER — FLUOXETINE HCL 20 MG PO CAPS: 20.0000 mg | ORAL_CAPSULE | Freq: Every day | ORAL | 3 refills | Status: DC

## 2020-06-04 MED ORDER — CLONAZEPAM 1 MG PO TABS: ORAL_TABLET | ORAL | 1 refills | Status: DC

## 2020-06-05 LAB — COMPREHENSIVE METABOLIC PANEL
ALT: 26 U/L (ref 0–35)
AST: 25 U/L (ref 0–37)
Albumin: 4.3 g/dL (ref 3.5–5.2)
Alkaline Phosphatase: 81 U/L (ref 39–117)
BUN: 12 mg/dL (ref 6–23)
CO2: 26 mEq/L (ref 19–32)
Calcium: 9.7 mg/dL (ref 8.4–10.5)
Chloride: 102 mEq/L (ref 96–112)
Creatinine, Ser: 0.76 mg/dL (ref 0.40–1.20)
GFR: 94.89 mL/min (ref >60.00)
Glucose, Bld: 88 mg/dL (ref 70–99)
Potassium: 4.2 mEq/L (ref 3.5–5.1)
Sodium: 138 mEq/L (ref 135–145)
Total Bilirubin: 0.4 mg/dL (ref 0.2–1.2)
Total Protein: 7.2 g/dL (ref 6.0–8.3)

## 2020-06-05 LAB — LIPID PANEL
Cholesterol: 232 mg/dL — ABNORMAL HIGH (ref 0–200)
HDL: 42.4 mg/dL (ref >39.00)
LDL Cholesterol: 159 mg/dL — ABNORMAL HIGH (ref 0–99)
NonHDL: 189.56
Total CHOL/HDL Ratio: 5
Triglycerides: 152 mg/dL — ABNORMAL HIGH (ref 0.0–149.0)
VLDL: 30.4 mg/dL (ref 0.0–40.0)

## 2020-06-05 LAB — CBC
HCT: 36.8 % (ref 36.0–46.0)
Hemoglobin: 11.9 g/dL — ABNORMAL LOW (ref 12.0–15.0)
MCHC: 32.4 g/dL (ref 30.0–36.0)
MCV: 93.7 fl (ref 78.0–100.0)
Platelets: 315 10*3/uL (ref 150.0–400.0)
RBC: 3.92 Mil/uL (ref 3.87–5.11)
RDW: 13.9 % (ref 11.5–15.5)
WBC: 4.8 10*3/uL (ref 4.0–10.5)

## 2020-06-05 LAB — HEMOGLOBIN A1C: Hgb A1c MFr Bld: 5.2 % (ref 4.6–6.5)

## 2020-06-06 ENCOUNTER — Encounter: Payer: Self-pay | Admitting: Family Medicine

## 2020-06-18 ENCOUNTER — Encounter: Payer: Self-pay | Admitting: Family Medicine

## 2020-06-18 DIAGNOSIS — F411 Generalized anxiety disorder: Secondary | ICD-10-CM

## 2020-06-18 MED ORDER — FLUOXETINE HCL 40 MG PO CAPS: 40.0000 mg | ORAL_CAPSULE | Freq: Every day | ORAL | 3 refills | Status: DC

## 2020-10-13 ENCOUNTER — Encounter: Payer: Self-pay | Admitting: Family Medicine

## 2020-10-13 ENCOUNTER — Ambulatory Visit (INDEPENDENT_AMBULATORY_CARE_PROVIDER_SITE_OTHER): Payer: 59 | Admitting: Family Medicine

## 2020-10-13 ENCOUNTER — Other Ambulatory Visit: Payer: Self-pay

## 2020-10-13 VITALS — BP 130/76 | HR 82 | Temp 98.2°F | Ht 62.5 in | Wt 178.5 lb

## 2020-10-13 DIAGNOSIS — Z01818 Encounter for other preprocedural examination: Secondary | ICD-10-CM

## 2020-10-13 NOTE — Progress Notes (Signed)
Subjective:   Chief Complaint  Patient presents with  . Needs EKG for sugery and PCP not available    Melinda Ferrell  is here for a Pre-operative physical at the request of Dr. Loletha Grayer.   She is having tummy tuck surgery on 10/15/20.  Personal or family hx of adverse outcome to anesthesia? No  Chipped, cracked, missing, or loose teeth? missing 1st molar on L mandibular Decreased ROM of neck? No  Able to walk up 2 flights of stairs without becoming significantly short of breath or having chest pain? Yes   Revised Goldman Criteria: High Risk Surgery (intraperitoneal, intrathoracic, aortic): No  Ischemic heart disease (Prior MI, +excercise stress test, angina, nitrate use, Qwave): No  History of heart failure: No  History of cerebrovascular disease: No  History of diabetes: No  Insulin therapy for DM: No  Preoperative Cr >2.0: No   Patient Active Problem List   Diagnosis Date Noted  . Insomnia 05/05/2016  . Dysmenorrhea 11/19/2012   Past Medical History:  Diagnosis Date  . Alcohol abuse    resolved 2012  . Allergy   . Cancer [facility]) 2004   breast  . Clotting disorder [facility])     Past Surgical History:  Procedure Laterality Date  . APPENDECTOMY    . BREAST SURGERY      Current Outpatient Medications  Medication Sig Dispense Refill  . clonazePAM (KLONOPIN) 1 MG tablet TAKE 1/2 TABLET BY MOUTH BY MOUTH DURING THE DAY AND 1 TABLET BY MOUTH AT BEDTIME AS NEEDED 135 tablet 1  . diphenhydrAMINE (BENADRYL) 25 MG tablet Take 25 mg by mouth every 6 (six) hours as needed.    Marland Kitchen FLUoxetine (PROZAC) 40 MG capsule Take 1 capsule (40 mg total) by mouth daily. 90 capsule 3   Allergies  Allergen Reactions  . Sulfa Antibiotics     Family History  Problem Relation Age of Onset  . Hypertension Mother   . Gout Mother   . Diabetes Father   . Hyperlipidemia Father   . Hypertension Father      Review of Systems:  Constitutional:  no fevers Eye:  no recent significant change in vision Ear:  no  hearing loss Nose/Mouth/Throat:  No dental complaints Neck/Thyroid:  no lumps or masses Pulmonary:  No shortness of breath Cardiovascular:  no chest pain Gastrointestinal:  no abdominal pain GU:  negative for dysuria Musculoskeletal/Extremities:  no pain Skin/Integumentary ROS:  no abnormal skin lesions reported Neurologic:  no HA   Objective:   Vitals:   10/13/20 1535  BP: 130/76  Pulse: 82  Temp: 98.2 F (36.8 C)  TempSrc: Oral  SpO2: 97%  Weight: 178 lb 8 oz (81 kg)  Height: 5' 2.5" (1.588 m)   Body mass index is 32.13 kg/m.  General:  well developed, well nourished, in no apparent distress Skin:  warm, no pallor or diaphoresis Head:  normocephalic, atraumatic Eyes:  pupils equal and round, sclera anicteric without injection Ears:  canals without lesions, TMs shiny without retraction, no obvious effusion, no erythema Throat/Pharynx:  lips and gingiva without lesion; tongue and uvula midline; non-inflamed pharynx; no exudates or postnasal drainage Neck: neck supple without adenopathy, thyromegaly, or masses, no bruits, no jugular venous distention Lungs:  clear to auscultation, breath sounds equal bilaterally, no respiratory distress Cardio:  regular rate and rhythm without murmurs Musculoskeletal:  symmetrical muscle groups noted without atrophy or deformity Extremities:  no clubbing, cyanosis, or edema, no deformities, no skin discoloration Neuro:  gait normal; deep tendon  reflexes normal and symmetric and alert and oriented to person, place, and time Psych: Age appropriate judgment and insight; normal mood   Assessment:   Pre-op evaluation - Plan: EKG 12-Lead   Plan:   Orders as above. EKG - normal sinus rhythm, nml axis, nml intervals, No ST seg changes, T wave inversion noted on V3. Doubt ischemia given lack of risk factors, s/s's, personal and famhx. Copy was given to pt to bring to surgery as requested by the surgery team and I was not asked to clear the pt  for procedure.   The patient voiced understanding and agreement to the plan.  [place], DO 10/13/20  4:52 PM

## 2020-10-13 NOTE — Patient Instructions (Addendum)
Good luck with your procedure.  No medications need to be held for the procedure.  Your EKG is normal.   Let me know right away if any form needs to be filled out.   Let us know if you need anything.

## 2020-10-14 ENCOUNTER — Telehealth: Payer: Self-pay | Admitting: Family Medicine

## 2020-10-14 NOTE — Telephone Encounter (Signed)
Caller: Velecia  Call Back # [phone]   Patient states she is having surgery tomorrow and that her doctor is requesting the actual EKG emailed to :    [email or web address]   [email or web address]

## 2020-10-14 NOTE — Telephone Encounter (Signed)
Will fax EKG to both addresses.

## 2020-10-14 NOTE — Telephone Encounter (Signed)
Called the patient informed sent, but first email had to go through twice and to make sure they have received. She opened it up and was received

## 2020-10-30 ENCOUNTER — Other Ambulatory Visit: Payer: Self-pay | Admitting: Family Medicine

## 2020-10-30 DIAGNOSIS — F5101 Primary insomnia: Secondary | ICD-10-CM

## 2020-10-30 DIAGNOSIS — F411 Generalized anxiety disorder: Secondary | ICD-10-CM

## 2020-10-31 ENCOUNTER — Encounter: Payer: Self-pay | Admitting: Family Medicine

## 2020-10-31 DIAGNOSIS — F411 Generalized anxiety disorder: Secondary | ICD-10-CM

## 2020-10-31 DIAGNOSIS — F5101 Primary insomnia: Secondary | ICD-10-CM

## 2020-10-31 NOTE — Telephone Encounter (Signed)
Requesting:klonopnin  Contract:n/a UDS:n/a Last Visit7/01/21 Next Visit:n/a Last Refill:06/2020  Please Advise

## 2020-11-01 MED ORDER — CLONAZEPAM 1 MG PO TABS: ORAL_TABLET | ORAL | 1 refills | Status: DC

## 2020-11-19 ENCOUNTER — Other Ambulatory Visit (HOSPITAL_COMMUNITY): Payer: Self-pay | Admitting: Interventional Radiology

## 2020-11-19 ENCOUNTER — Other Ambulatory Visit (HOSPITAL_COMMUNITY): Payer: Self-pay | Admitting: Plastic Surgery

## 2020-11-19 DIAGNOSIS — L0291 Cutaneous abscess, unspecified: Secondary | ICD-10-CM

## 2020-11-23 ENCOUNTER — Other Ambulatory Visit: Payer: Self-pay | Admitting: Radiology

## 2020-11-24 ENCOUNTER — Other Ambulatory Visit: Payer: Self-pay | Admitting: Radiology

## 2020-11-25 ENCOUNTER — Encounter (HOSPITAL_COMMUNITY): Payer: Self-pay

## 2020-11-25 ENCOUNTER — Other Ambulatory Visit: Payer: Self-pay

## 2020-11-25 ENCOUNTER — Ambulatory Visit (HOSPITAL_COMMUNITY)
Admission: RE | Admit: 2020-11-25 | Discharge: 2020-11-25 | Disposition: A | Discharge status: Home / Self Care | Payer: 59 | Source: Ambulatory Visit

## 2020-11-25 ENCOUNTER — Ambulatory Visit (HOSPITAL_COMMUNITY)
Admission: RE | Admit: 2020-11-25 | Discharge: 2020-11-25 | Disposition: A | Discharge status: Home / Self Care | Payer: 59 | Source: Ambulatory Visit | Attending: Interventional Radiology | Admitting: Interventional Radiology

## 2020-11-25 ENCOUNTER — Other Ambulatory Visit (HOSPITAL_COMMUNITY): Payer: Self-pay | Admitting: Interventional Radiology

## 2020-11-25 DIAGNOSIS — L0291 Cutaneous abscess, unspecified: Secondary | ICD-10-CM | POA: Diagnosis present

## 2020-11-25 LAB — CBC WITH DIFFERENTIAL/PLATELET
Abs Immature Granulocytes: 0.02 10*3/uL (ref 0.00–0.07)
Basophils Absolute: 0 10*3/uL (ref 0.0–0.1)
Basophils Relative: 0 %
Eosinophils Absolute: 0.1 10*3/uL (ref 0.0–0.5)
Eosinophils Relative: 2 %
HCT: 32.6 % — ABNORMAL LOW (ref 36.0–46.0)
Hemoglobin: 10.3 g/dL — ABNORMAL LOW (ref 12.0–15.0)
Immature Granulocytes: 0 %
Lymphocytes Relative: 38 %
Lymphs Abs: 2.2 10*3/uL (ref 0.7–4.0)
MCH: 28.9 pg (ref 26.0–34.0)
MCHC: 31.6 g/dL (ref 30.0–36.0)
MCV: 91.3 fL (ref 80.0–100.0)
Monocytes Absolute: 0.7 10*3/uL (ref 0.1–1.0)
Monocytes Relative: 13 %
Neutro Abs: 2.7 10*3/uL (ref 1.7–7.7)
Neutrophils Relative %: 47 %
Platelets: 425 10*3/uL — ABNORMAL HIGH (ref 150–400)
RBC: 3.57 MIL/uL — ABNORMAL LOW (ref 3.87–5.11)
RDW: 13.6 % (ref 11.5–15.5)
WBC: 5.7 10*3/uL (ref 4.0–10.5)
nRBC: 0 % (ref 0.0–0.2)

## 2020-11-25 LAB — COMPREHENSIVE METABOLIC PANEL
ALT: 24 U/L (ref 0–44)
AST: 22 U/L (ref 15–41)
Albumin: 3 g/dL — ABNORMAL LOW (ref 3.5–5.0)
Alkaline Phosphatase: 90 U/L (ref 38–126)
Anion gap: 12 (ref 5–15)
BUN: 9 mg/dL (ref 6–20)
CO2: 25 mmol/L (ref 22–32)
Calcium: 8.1 mg/dL — ABNORMAL LOW (ref 8.9–10.3)
Chloride: 101 mmol/L (ref 98–111)
Creatinine, Ser: 0.73 mg/dL (ref 0.44–1.00)
GFR, Estimated: >60 mL/min (ref >60)
Glucose, Bld: 110 mg/dL — ABNORMAL HIGH (ref 70–99)
Potassium: 2.8 mmol/L — ABNORMAL LOW (ref 3.5–5.1)
Sodium: 138 mmol/L (ref 135–145)
Total Bilirubin: 0.4 mg/dL (ref 0.3–1.2)
Total Protein: 6.8 g/dL (ref 6.5–8.1)

## 2020-11-25 LAB — PROTIME-INR
INR: 1 (ref 0.8–1.2)
Prothrombin Time: 12.3 seconds (ref 11.4–15.2)

## 2020-11-25 MED ORDER — LIDOCAINE HCL (PF) 1 % IJ SOLN
INTRAMUSCULAR | Status: AC | PRN
  Administered 2020-11-25: 10 mL

## 2020-11-25 MED ORDER — SODIUM CHLORIDE 0.9 % IV SOLN: INTRAVENOUS | Status: DC

## 2020-11-25 MED ORDER — ALCOHOL 98 % IJ SOLN
INTRAMUSCULAR | Status: AC
  Filled 2020-11-25: qty 25

## 2020-11-25 MED ORDER — FENTANYL CITRATE (PF) 100 MCG/2ML IJ SOLN
INTRAMUSCULAR | Status: AC
  Filled 2020-11-25: qty 2

## 2020-11-25 MED ORDER — DIPHENHYDRAMINE HCL 50 MG/ML IJ SOLN
INTRAMUSCULAR | Status: AC
  Filled 2020-11-25: qty 1

## 2020-11-25 MED ORDER — ALCOHOL 98 % IJ SOLN
30.0000 mL | Freq: Once | INTRAMUSCULAR | Status: AC
  Filled 2020-11-25: qty 30

## 2020-11-25 MED ORDER — MIDAZOLAM HCL 2 MG/2ML IJ SOLN
INTRAMUSCULAR | Status: AC
  Filled 2020-11-25: qty 4

## 2020-11-25 MED ORDER — POTASSIUM CHLORIDE CRYS ER 20 MEQ PO TBCR
40.0000 meq | EXTENDED_RELEASE_TABLET | Freq: Once | ORAL | Status: DC
  Filled 2020-11-25: qty 2

## 2020-11-25 MED ORDER — POTASSIUM CHLORIDE CRYS ER 20 MEQ PO TBCR
40.0000 meq | EXTENDED_RELEASE_TABLET | Freq: Two times a day (BID) | ORAL | Status: DC
  Administered 2020-11-25: 40 meq via ORAL
  Filled 2020-11-25: qty 2

## 2020-11-25 MED ORDER — ALCOHOL 98 % IJ SOLN
INTRAMUSCULAR | Status: AC
  Administered 2020-11-25: 30 mL
  Filled 2020-11-25: qty 5

## 2020-11-25 MED ORDER — SODIUM CHLORIDE 0.9% FLUSH: 5.0000 mL | Freq: Three times a day (TID) | INTRAVENOUS | Status: DC

## 2020-11-25 NOTE — Consult Note (Addendum)
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 female with past medical history of left breast cancer 2004 with prior chemo and radiation, and recent abdominoplasty on 10/15/2020.  Postop patient had 2 abdominal drains placed of which right-sided drain has been removed in the interim.  She currently has a left-sided abdominal drain in place with turbid pink-colored fluid in JP bulb.  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   Past Medical History:  Diagnosis Date  . Alcohol abuse    resolved 2012  . Allergy   . Cancer [facility]) 2004   breast- left  . Clotting disorder [facility])     Past Surgical History:  Procedure Laterality Date  . APPENDECTOMY    . BREAST SURGERY Left 2004   had chemo and radiation    Allergies: Sulfa antibiotics  Medications: Prior to Admission medications   Medication Sig Start Date End Date Taking? Authorizing Provider  cetirizine (ZYRTEC) 10 MG tablet Take 10 mg by mouth daily.   Yes [provider]  clonazePAM (KLONOPIN) 1 MG tablet TAKE 1/2 TABLET BY MOUTH BY MOUTH DURING THE DAY AND 1 TABLET BY MOUTH AT BEDTIME AS NEEDED 11/01/20  Yes Copland, Gay Filler, MD  diphenhydrAMINE (BENADRYL) 25 MG tablet Take 25 mg by mouth every 6 (six) hours as needed.   Yes [provider]  FLUoxetine (PROZAC) 40 MG capsule Take 1 capsule (40 mg total) by mouth daily. 06/18/20  Yes Copland, Gay Filler, MD     Family History  Problem Relation Age of Onset  . Hypertension Mother   . Gout Mother   . Diabetes Father   . Hyperlipidemia Father   . Hypertension Father     Social History    Socioeconomic History  . Marital status: Widowed    Spouse name: Not on file  . Number of children: Not on file  . Years of education: Not on file  . Highest education level: Not on file  Occupational History  . Not on file  Tobacco Use  . Smoking status: Former Smoker    Packs/day: 0.20    Types: Cigarettes    Quit date: 11/21/2015    Years since quitting: 5.0  . Smokeless tobacco: Never Used  Substance and Sexual Activity  . Alcohol use: No    Alcohol/week: 0.0 standard drinks  . Drug use: No  . Sexual activity: Yes    Birth control/protection: Post-menopausal  Other Topics Concern  . Not on file  Social History Narrative  . Not on file   Social Determinants of Health   Financial Resource Strain: Not on file  Food Insecurity: Not on file  Transportation Needs: Not on file  Physical Activity: Not on file  Stress: Not on file  Social Connections: Not on file      Review of Systems currently denies fever, headache, chest pain, dyspnea, cough, back pain, nausea, vomiting or bleeding.  Does have some lower abdominal soreness  Vital Signs: BP 130/83   Pulse 80   Temp 98.6 F (37 C) (Oral)   Resp 18   Ht 5' 2.5" (1.588 m)   Wt 169 lb (76.7 kg)   SpO2 98%   BMI 30.42 kg/m  Physical Exam awake, alert.  Chest clear to auscultation bilaterally.  Heart with regular rate and rhythm.  Abdomen soft, positive bowel sounds, clean horizontal lower abdominal scar, left abdominal drain in place draining turbid pink color.  Insertion site mildly tender to palpation.  No lower extremity edema.  Imaging: No results found.  Labs:  CBC: Recent Labs    06/04/20 1430 11/25/20 0715  WBC 4.8 5.7  HGB 11.9* 10.3*  HCT 36.8 32.6*  PLT 315.0 425*    COAGS: Recent Labs    11/25/20 0715  INR 1.0    BMP: Recent Labs    06/04/20 1430 11/25/20 0715  NA 138 138  K 4.2 2.8*  CL 102 101  CO2 26 25  GLUCOSE 88 110*  BUN 12 9  CALCIUM 9.7 8.1*  CREATININE 0.76  0.73  GFRNONAA  --  >60    LIVER FUNCTION TESTS: Recent Labs    06/04/20 1430 11/25/20 0715  BILITOT 0.4 0.4  AST 25 22  ALT 26 24  ALKPHOS 81 90  PROT 7.2 6.8  ALBUMIN 4.3 3.0*    TUMOR MARKERS: No results for input(s): AFPTM, CEA, CA199, CHROMGRNA in the last 8760 hours.  Assessment and Plan: 58 y.o. female with past medical history of left breast cancer 2004 with prior chemo and radiation, and recent abdominoplasty on 10/15/2020.  Postop patient had 2 abdominal drains placed of which right-sided drain has been removed in the interim.  She currently has a left-sided abdominal drain in place with turbid pink-colored fluid in JP bulb.  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  Details/risks of procedures, including but not limited to, internal bleeding, infection, injury to adjacent structures discussed with patient with her understanding and consent.  Potassium today 2.8, will supplement.   Thank you for this interesting consult.  I greatly enjoyed meeting Melinda Ferrell and look forward to participating in their care.  A copy of this report was sent to the requesting provider on this date.  Electronically Signed: D. Rowe Robert, PA-C 11/25/2020, 8:11 AM   I spent a total of 25 minutes  in face to face in clinical consultation, greater than 50% of which was counseling/coordinating care for CT-guided abdominal drain revision/exchange with possible alcohol sclerosis of seroma

## 2020-11-25 NOTE — Procedures (Signed)
  Procedure: CT guided exchange LLQ drain catheter and EtOH sclerosis 62f EBL:   minimal Complications:  none immediate  See full dictation in [employer].  Dillard Cannon MD Main # [phone] Pager  [phone] Mobile [phone]

## 2020-11-25 NOTE — Discharge Instructions (Signed)
Urgent needs - IR on call MD [phone]  Wound -   Keep site clean and dry.   Do not submerge in tub or water until site healing well.  Drain instructions per MD  IR willl follow up Pt regarding percutaneous drainage catheter drainage amount.   Percutaneous Drain, Care After This sheet gives you information about how to care for yourself after your procedure. Your health care provider may also give you more specific instructions. If you have problems or questions, contact your health care provider. What can I expect after the procedure? After your procedure, it is common to have:  A small amount of bruising and discomfort in the area where the drainage tube (catheter) was placed.  Sleepiness and fatigue. This should go away after the medicines you were given have worn off. Follow these instructions at home: Incision care  Follow instructions from your health care provider about how to take care of your incision. Make sure you: ? Wash your hands with soap and water before you change your bandage (dressing). If soap and water are not available, use hand sanitizer. ? Change your dressing as told by your health care provider. ? Leave stitches (sutures), skin glue, or adhesive strips in place. These skin closures may need to stay in place for 2 weeks or longer. If adhesive strip edges start to loosen and curl up, you may trim the loose edges. Do not remove adhesive strips completely unless your health care provider tells you to do that.  Check your incision area every day for signs of infection. Check for: ? More redness, swelling, or pain. ? More fluid or blood. ? Warmth. ? Pus or a bad smell. ? Fluid leaking from around your catheter (instead of fluid draining through your catheter). Catheter care  Follow instructions from your health care provider about emptying and cleaning your catheter and collection bag. You may need to clean the catheter every day so it does not clog.  If  directed, write down the following information every time you empty your bag: ? The date and time. ? The amount of drainage. General instructions  Rest at home for 1-2 days after your procedure. Return to your normal activities as told by your health care provider.  Do not take baths, swim, or use a hot tub for 24 hours after your procedure, or until your health care provider says that this is okay.  Take over-the-counter and prescription medicines only as told by your health care provider.  Keep all follow-up visits as told by your health care provider. This is important. Contact a health care provider if:  You have less than 10 mL of drainage a day for 2-3 days in a row, or as directed by your health care provider.  You have more redness, swelling, or pain around your incision area.  You have more fluid or blood coming from your incision area.  Your incision area feels warm to the touch.  You have pus or a bad smell coming from your incision area.  You have fluid leaking from around your catheter (instead of through your catheter).  You have a fever or chills.  You have pain that does not get better with medicine. Get help right away if:  Your catheter comes out.  You suddenly stop having drainage from your catheter.  You suddenly have blood in the fluid that is draining from your catheter.  You become dizzy or you faint.  You develop a rash.  You have  nausea or vomiting.  You have difficulty breathing or you feel short of breath.  You develop chest pain.  You have problems with your speech or vision.  You have trouble balancing or moving your arms or legs. Summary  It is common to have a small amount of bruising and discomfort in the area where the drainage tube (catheter) was placed.  You may be directed to record the amount of drainage from the bag every time you empty it.  Follow instructions from your health care provider about emptying and cleaning your  catheter and collection bag. This information is not intended to replace advice given to you by your health care provider. Make sure you discuss any questions you have with your health care provider. Document Revised: 11/03/2017 Document Reviewed: 10/13/2016 Elsevier Patient Education  2020 [employer].    I

## 2020-11-26 ENCOUNTER — Encounter: Payer: Self-pay | Admitting: Family Medicine

## 2020-11-26 DIAGNOSIS — E876 Hypokalemia: Secondary | ICD-10-CM

## 2020-11-30 MED ORDER — POTASSIUM CHLORIDE CRYS ER 20 MEQ PO TBCR: 20.0000 meq | EXTENDED_RELEASE_TABLET | Freq: Every day | ORAL | 3 refills | Status: DC

## 2020-11-30 NOTE — Addendum Note (Signed)
Addended by: Abbe Amsterdam C on: 11/30/2020 04:45 PM   Modules accepted: Orders

## 2020-12-02 ENCOUNTER — Inpatient Hospital Stay (HOSPITAL_COMMUNITY): Admission: RE | Admit: 2020-12-02 | Payer: 59 | Source: Ambulatory Visit

## 2020-12-02 ENCOUNTER — Encounter (HOSPITAL_COMMUNITY): Payer: Self-pay

## 2020-12-03 ENCOUNTER — Telehealth: Payer: Self-pay | Admitting: Student

## 2020-12-03 NOTE — Telephone Encounter (Signed)
Late entry  I spoke with the patient by telephone 12/02/20 for a check on her drain output. Drain output is ranging from 75-175 per day. She is not having any issues related to the drain and she is flushing it daily, changing the dressing as needed.   I told Melinda Ferrell that I would call in one week to check with her again about her drain output. When the drain output is <20 ml per day she will come to the clinic for an evaluation and possible drain removal. She verbalized understanding of this information and she has the number to call IR with any questions.  Alwyn Ren, [place] [phone] 12/03/2020, 4:49 PM

## 2020-12-09 ENCOUNTER — Telehealth: Payer: Self-pay | Admitting: Student

## 2020-12-09 ENCOUNTER — Other Ambulatory Visit: Payer: Self-pay | Admitting: Interventional Radiology

## 2020-12-09 DIAGNOSIS — K651 Peritoneal abscess: Secondary | ICD-10-CM

## 2020-12-09 NOTE — Telephone Encounter (Signed)
Patient continues to have drain output averaging 45 ml/day. Dr. Deanne Coffer would like to follow up with the patient early next week (Monday or Tuesday; phone call) to check on the drain output. If it continues to remain high Dr. Deanne Coffer may want her to come back in for a re-sclerosing procedure.   This information was relayed to the patient and she understands someone will call her next week. She has the number to the clinic if she has any questions.  Alwyn Ren, [place] [phone] 12/09/2020, 9:40 AM

## 2020-12-15 ENCOUNTER — Telehealth (HOSPITAL_COMMUNITY): Payer: Self-pay | Admitting: Interventional Radiology

## 2020-12-15 ENCOUNTER — Telehealth: Payer: 59

## 2020-12-15 NOTE — Progress Notes (Signed)
Patient ID: Melinda Ferrell, female   DOB: 1961-05-02, 60 y.o.   MRN: [id number]  S/O: I called her on the phone today. She is doing well.  Output is down to a little over 30/day over the past few days.  Only 10 mL out today, so far.  She usually checks and measures at 6 PM. We discussed 20 mL/day output as the threshold for catheter removal.  Options include continued watchful waiting as the output is decreasing, or repeating the ethanol ablation through the existing catheter.  She preferred to be patient, and monitor the outputs.  I think this is fine.  I told her I want to see it at or below 20 mL/day for 3 consecutive days and then we can take the catheter out.  This could be done at the hospital or clinic.  She will call our APP line and let us know when she achieves that goal, or if she has any of her questions, problems, or concerns. A: Continued decrease in the  output from lymphocele drain.  No complicating factors.   P: Catheter removal when output less than 20 mL/day X 3 days.

## 2020-12-23 ENCOUNTER — Telehealth: Payer: Self-pay | Admitting: Radiology

## 2020-12-23 NOTE — Progress Notes (Signed)
Patient ID: Melinda Ferrell, female   DOB: Nov 29, 1961, 60 y.o.   MRN: [id number]  Procedure performed 11/25/20: Dr Vernard Gambles Persistent painless serosanguineous outflow from subcutaneous surgical drain catheter 6 weeks post procedure, presumably lymphatic in etiology. Sclerosis requested.  FINDINGS: Small volume residual gas and fluid in the deep subcutaneous body wall collection. The surgical drain catheter was positioned extending across midline. The drain catheter was exchanged for a pigtail drain as above, placed centrally in the residual collection, and ethanol sclerosis performed as above.  IMPRESSION: 1. Technically successful revision of drain catheter under CT guidance, with subsequent ethanol sclerosis. PLAN: Telehealth follow-up in 1 week with plans for drain removal once output less than 20 mL per day  Pt calls today Has a list of Output since Jan 13th Daily under 20 cc  Discussed with Dr Vernard Gambles He approves removal today She comes to [facility] IR Drain removed without complication Dressing placed  Keep clean and dry 48 hrs Call if need [place]-- she has good understanding of plan

## 2021-05-12 ENCOUNTER — Encounter: Payer: Self-pay | Admitting: Family Medicine

## 2021-05-12 DIAGNOSIS — F5101 Primary insomnia: Secondary | ICD-10-CM

## 2021-05-12 DIAGNOSIS — F411 Generalized anxiety disorder: Secondary | ICD-10-CM

## 2021-05-12 MED ORDER — CLONAZEPAM 1 MG PO TABS: ORAL_TABLET | ORAL | 0 refills | Status: DC

## 2021-06-10 NOTE — Progress Notes (Addendum)
[place] [facility] [address] [place], [place] [address] [phone]  Date:  06/17/2021   Name:  Melinda Ferrell   DOB:  [birth year]   MRN:  [id number]  PCP:  Darreld Mclean, MD    Chief Complaint: Annual Exam (Concerns/ questions: pt sometimes feels like she is not emptying her bladder /Pap/ mamm: ses gyn/Tdap: due)   History of Present Illness:  Melinda Ferrell is a 60 y.o. very pleasant female patient who presents with the following:  Pt seen today for a CPE- history of insomnia, dysmenorrhea, blood clotting disorder.  Prior history of breast cancer 2004 and alcohol abuse Last visit with myself about one year ago - at that time she was thinking of doing a tummy tuck through plastic surgery- she ended up getting this done last fall.  She is happy with the result but it was a tough procedure   Uses clonazepam most nights for insomnia- this helps her sleep Last year we added prozac to her regimen for her anxiety.  She feels like this is working well for her at 40 mg  She works as a Medical illustrator for [employer].  She is working from home right now except for 2 days per week  Pap- per GYN DR Dillard - Mammo- per her GYN  Covid booster- done  Tetanus- will do today  Colon UTD- on 5 year plan   She had a low potassium around the time of her surgery She is not taking potassium now - we will check he K level  She notes that sometimes when she is urinating she will feel like her stream is over, but she is still passing urine  No dysuria No incontinence Does not happen all the time She has noted this for about 3 months - most often occurred earlier this week Admits to not getting a lot of exercise  Patient Active Problem List   Diagnosis Date Noted   Insomnia 05/05/2016   Dysmenorrhea 11/19/2012    Past Medical History:  Diagnosis Date   Alcohol abuse    resolved 2012   Allergy    Cancer [facility]) 2004    breast- left   Clotting disorder [facility])     Past Surgical History:  Procedure Laterality Date   APPENDECTOMY     BREAST SURGERY Left 2004   had chemo and radiation    Social History   Tobacco Use   Smoking status: Former    Packs/day: 0.20    Types: Cigarettes    Quit date: 11/21/2015    Years since quitting: 5.5   Smokeless tobacco: Never  Substance Use Topics   Alcohol use: No    Alcohol/week: 0.0 standard drinks   Drug use: No    Family History  Problem Relation Age of Onset   Hypertension Mother    Gout Mother    Diabetes Father    Hyperlipidemia Father    Hypertension Father     Allergies  Allergen Reactions   Sulfa Antibiotics     Medication list has been reviewed and updated.  Current Outpatient Medications on File Prior to Visit  Medication Sig Dispense Refill   cetirizine (ZYRTEC) 10 MG tablet Take 10 mg by mouth daily.     clonazePAM (KLONOPIN) 1 MG tablet TAKE 1/2 TABLET BY MOUTH BY MOUTH DURING THE DAY AND 1 TABLET BY MOUTH AT BEDTIME AS NEEDED 135 tablet 0  diphenhydrAMINE (BENADRYL) 25 MG tablet Take 25 mg by mouth every 6 (six) hours as needed.     FLUoxetine (PROZAC) 40 MG capsule TAKE 1 CAPSULE(40 MG) BY MOUTH DAILY 90 capsule 3   potassium chloride SA (KLOR-CON) 20 MEQ tablet Take 1 tablet (20 mEq total) by mouth daily. Take for 5 days 5 tablet 3   No current facility-administered medications on file prior to visit.    Review of Systems:  As per HPI- otherwise negative.   Physical Examination: Vitals:   06/17/21 0816  BP: 120/60  Pulse: 74  Resp: 18  Temp: 97.8 F (36.6 C)  SpO2: 97%   Vitals:   06/17/21 0816  Weight: 173 lb 6.4 oz (78.7 kg)  Height: 5' 2.5" (1.588 m)   Body mass index is 31.21 kg/m. Ideal Body Weight: Weight in (lb) to have BMI = 25: 138.6  GEN: no acute distress.  Obese, looks well  HEENT: Atraumatic, Normocephalic.  Ears and Nose: No external deformity. CV: RRR, No M/G/R. No JVD. No thrill. No  extra heart sounds. PULM: CTA B, no wheezes, crackles, rhonchi. No retractions. No resp. distress. No accessory muscle use. ABD: S, NT, ND, +BS. No rebound. No HSM. EXTR: No c/c/e PSYCH: Normally interactive. Conversant.   Wt Readings from Last 3 Encounters:  06/17/21 173 lb 6.4 oz (78.7 kg)  11/25/20 169 lb (76.7 kg)  10/13/20 178 lb 8 oz (81 kg)    Assessment and Plan: Physical exam  GAD (generalized anxiety disorder)  Primary insomnia  Hypokalemia - Plan: Comprehensive metabolic panel  Screening for diabetes mellitus - Plan: Comprehensive metabolic panel, Hemoglobin A1c  Screening for deficiency anemia - Plan: CBC  Screening for hyperlipidemia - Plan: Lipid panel  Screening for thyroid disorder - Plan: TSH  Fatigue, unspecified type - Plan: TSH, VITAMIN D 25 Hydroxy (Vit-D Deficiency, Fractures)  Urinary urgency - Plan: Urine Culture, POCT urinalysis dipstick  Immunization due - Plan: Td vaccine greater than or equal to 7yo preservative free IM  Physical exam today Encouraged healthy diet and exercise routine  Will plan further follow- up pending labs. She has noted some changes when she is urinating recently.  No incontinence.  Urine culture pending, dipstick negative This visit occurred during the SARS-CoV-2 public health emergency.  Safety protocols were in place, including screening questions prior to the visit, additional usage of staff PPE, and extensive cleaning of exam room while observing appropriate contact time as indicated for disinfecting solutions.   Signed Lamar Blinks, MD  Received her labs as below, message to patient Results for orders placed or performed in visit on 06/17/21  CBC  Result Value Ref Range   WBC 4.2 4.0 - 10.5 K/uL   RBC 3.99 3.87 - 5.11 Mil/uL   Platelets 307.0 150.0 - 400.0 K/uL   Hemoglobin 12.2 12.0 - 15.0 g/dL   HCT 37.7 36.0 - 46.0 %   MCV 94.4 78.0 - 100.0 fl   MCHC 32.3 30.0 - 36.0 g/dL   RDW 14.2 11.5 - 15.5 %   Comprehensive metabolic panel  Result Value Ref Range   Sodium 139 135 - 145 mEq/L   Potassium 4.3 3.5 - 5.1 mEq/L   Chloride 104 96 - 112 mEq/L   CO2 26 19 - 32 mEq/L   Glucose, Bld 104 (H) 70 - 99 mg/dL   BUN 15 6 - 23 mg/dL   Creatinine, Ser 0.81 0.40 - 1.20 mg/dL   Total Bilirubin 0.4 0.2 - 1.2 mg/dL  Alkaline Phosphatase 88 39 - 117 U/L   AST 41 (H) 0 - 37 U/L   ALT 85 (H) 0 - 35 U/L   Total Protein 6.7 6.0 - 8.3 g/dL   Albumin 3.8 3.5 - 5.2 g/dL   GFR 80.19 >60.00 mL/min   Calcium 9.1 8.4 - 10.5 mg/dL  Hemoglobin A1c  Result Value Ref Range   Hgb A1c MFr Bld 5.5 4.6 - 6.5 %  Lipid panel  Result Value Ref Range   Cholesterol 155 0 - 200 mg/dL   Triglycerides 183.0 (H) 0.0 - 149.0 mg/dL   HDL 60.90 >39.00 mg/dL   VLDL 36.6 0.0 - 40.0 mg/dL   LDL Cholesterol 57 0 - 99 mg/dL   Total CHOL/HDL Ratio 3    NonHDL 94.06   TSH  Result Value Ref Range   TSH 2.60 0.35 - 5.50 uIU/mL  VITAMIN D 25 Hydroxy (Vit-D Deficiency, Fractures)  Result Value Ref Range   VITD 27.40 (L) 30.00 - 100.00 ng/mL  POCT urinalysis dipstick  Result Value Ref Range   Color, UA yellow yellow   Clarity, UA clear clear   Glucose, UA negative negative mg/dL   Bilirubin, UA negative negative   Ketones, POC UA negative negative mg/dL   Spec Grav, UA 1.020 1.010 - 1.025   Blood, UA negative negative   pH, UA 5.5 5.0 - 8.0   Protein Ur, POC negative negative mg/dL   Urobilinogen, UA negative (A) 0.2 or 1.0 E.U./dL   Nitrite, UA Negative Negative   Leukocytes, UA Trace (A) Negative   Received urine culture 7/17-  she does have a UTI Called in [facility]ox for her- message to pt MICRO NUMBER: [id number]   SPECIMEN QUALITY: Adequate   Sample Source NOT GIVEN   STATUS: FINAL   ISOLATE 1: Proteus mirabilis Abnormal    Comment: 50,000-100,000 CFU/mL of Proteus mirabilis  Resulting Agency [facility] [place]

## 2021-06-10 NOTE — Patient Instructions (Addendum)
Good to see you again today!  Take care and I will be in touch with your labs You got your tetanus shot today We will check on your potassium and a urine culture Work on getting more exercise/ physical activity into your routine

## 2021-06-13 ENCOUNTER — Other Ambulatory Visit: Payer: Self-pay | Admitting: Family Medicine

## 2021-06-13 DIAGNOSIS — F411 Generalized anxiety disorder: Secondary | ICD-10-CM

## 2021-06-17 ENCOUNTER — Other Ambulatory Visit: Payer: Self-pay

## 2021-06-17 ENCOUNTER — Ambulatory Visit (INDEPENDENT_AMBULATORY_CARE_PROVIDER_SITE_OTHER): Payer: 59 | Admitting: Family Medicine

## 2021-06-17 ENCOUNTER — Encounter: Payer: Self-pay | Admitting: Family Medicine

## 2021-06-17 VITALS — BP 120/60 | HR 74 | Temp 97.8°F | Resp 18 | Ht 62.5 in | Wt 173.4 lb

## 2021-06-17 DIAGNOSIS — F411 Generalized anxiety disorder: Secondary | ICD-10-CM | POA: Diagnosis not present

## 2021-06-17 DIAGNOSIS — Z0000 Encounter for general adult medical examination without abnormal findings: Secondary | ICD-10-CM | POA: Diagnosis not present

## 2021-06-17 DIAGNOSIS — Z130 Encounter for screening for diseases of the blood and blood-forming organs and certain disorders involving the immune mechanism: Secondary | ICD-10-CM | POA: Diagnosis not present

## 2021-06-17 DIAGNOSIS — Z131 Encounter for screening for diabetes mellitus: Secondary | ICD-10-CM

## 2021-06-17 DIAGNOSIS — E876 Hypokalemia: Secondary | ICD-10-CM | POA: Diagnosis not present

## 2021-06-17 DIAGNOSIS — R7401 Elevation of levels of liver transaminase levels: Secondary | ICD-10-CM

## 2021-06-17 DIAGNOSIS — Z1329 Encounter for screening for other suspected endocrine disorder: Secondary | ICD-10-CM

## 2021-06-17 DIAGNOSIS — Z23 Encounter for immunization: Secondary | ICD-10-CM | POA: Diagnosis not present

## 2021-06-17 DIAGNOSIS — F5101 Primary insomnia: Secondary | ICD-10-CM

## 2021-06-17 DIAGNOSIS — R5383 Other fatigue: Secondary | ICD-10-CM | POA: Diagnosis not present

## 2021-06-17 DIAGNOSIS — R3915 Urgency of urination: Secondary | ICD-10-CM | POA: Diagnosis not present

## 2021-06-17 DIAGNOSIS — Z13220 Encounter for screening for lipoid disorders: Secondary | ICD-10-CM | POA: Diagnosis not present

## 2021-06-17 LAB — COMPREHENSIVE METABOLIC PANEL
ALT: 85 U/L — ABNORMAL HIGH (ref 0–35)
AST: 41 U/L — ABNORMAL HIGH (ref 0–37)
Albumin: 3.8 g/dL (ref 3.5–5.2)
Alkaline Phosphatase: 88 U/L (ref 39–117)
BUN: 15 mg/dL (ref 6–23)
CO2: 26 mEq/L (ref 19–32)
Calcium: 9.1 mg/dL (ref 8.4–10.5)
Chloride: 104 mEq/L (ref 96–112)
Creatinine, Ser: 0.81 mg/dL (ref 0.40–1.20)
GFR: 80.19 mL/min (ref >60.00)
Glucose, Bld: 104 mg/dL — ABNORMAL HIGH (ref 70–99)
Potassium: 4.3 mEq/L (ref 3.5–5.1)
Sodium: 139 mEq/L (ref 135–145)
Total Bilirubin: 0.4 mg/dL (ref 0.2–1.2)
Total Protein: 6.7 g/dL (ref 6.0–8.3)

## 2021-06-17 LAB — TSH: TSH: 2.6 u[IU]/mL (ref 0.35–5.50)

## 2021-06-17 LAB — CBC
HCT: 37.7 % (ref 36.0–46.0)
Hemoglobin: 12.2 g/dL (ref 12.0–15.0)
MCHC: 32.3 g/dL (ref 30.0–36.0)
MCV: 94.4 fl (ref 78.0–100.0)
Platelets: 307 10*3/uL (ref 150.0–400.0)
RBC: 3.99 Mil/uL (ref 3.87–5.11)
RDW: 14.2 % (ref 11.5–15.5)
WBC: 4.2 10*3/uL (ref 4.0–10.5)

## 2021-06-17 LAB — POCT URINALYSIS DIP (MANUAL ENTRY)
Bilirubin, UA: NEGATIVE
Blood, UA: NEGATIVE
Glucose, UA: NEGATIVE mg/dL
Ketones, POC UA: NEGATIVE mg/dL
Nitrite, UA: NEGATIVE
Protein Ur, POC: NEGATIVE mg/dL
Spec Grav, UA: 1.02 (ref 1.010–1.025)
Urobilinogen, UA: NEGATIVE E.U./dL — AB
pH, UA: 5.5 (ref 5.0–8.0)

## 2021-06-17 LAB — LIPID PANEL
Cholesterol: 155 mg/dL (ref 0–200)
HDL: 60.9 mg/dL (ref >39.00)
LDL Cholesterol: 57 mg/dL (ref 0–99)
NonHDL: 94.06
Total CHOL/HDL Ratio: 3
Triglycerides: 183 mg/dL — ABNORMAL HIGH (ref 0.0–149.0)
VLDL: 36.6 mg/dL (ref 0.0–40.0)

## 2021-06-17 LAB — HEMOGLOBIN A1C: Hgb A1c MFr Bld: 5.5 % (ref 4.6–6.5)

## 2021-06-17 LAB — VITAMIN D 25 HYDROXY (VIT D DEFICIENCY, FRACTURES): VITD: 27.4 ng/mL — ABNORMAL LOW (ref 30.00–100.00)

## 2021-06-19 LAB — URINE CULTURE
MICRO NUMBER:: 12119333
SPECIMEN QUALITY:: ADEQUATE

## 2021-06-20 ENCOUNTER — Encounter: Payer: Self-pay | Admitting: Family Medicine

## 2021-06-20 MED ORDER — AMOXICILLIN 500 MG PO CAPS: 500.0000 mg | ORAL_CAPSULE | Freq: Three times a day (TID) | ORAL | 0 refills | Status: AC

## 2021-06-20 NOTE — Addendum Note (Signed)
Addended by: Lamar Blinks C on: 06/20/2021 01:40 PM   Modules accepted: Orders

## 2021-06-25 ENCOUNTER — Encounter: Payer: Self-pay | Admitting: Family Medicine

## 2021-06-25 MED ORDER — FLUCONAZOLE 150 MG PO TABS: 150.0000 mg | ORAL_TABLET | Freq: Once | ORAL | 0 refills | Status: AC

## 2021-07-12 ENCOUNTER — Encounter: Payer: Self-pay | Admitting: Family Medicine

## 2021-07-21 ENCOUNTER — Other Ambulatory Visit: Payer: 59

## 2021-08-25 ENCOUNTER — Other Ambulatory Visit: Payer: Self-pay | Admitting: Family Medicine

## 2021-08-25 DIAGNOSIS — F411 Generalized anxiety disorder: Secondary | ICD-10-CM

## 2021-08-25 DIAGNOSIS — F5101 Primary insomnia: Secondary | ICD-10-CM

## 2021-08-26 MED ORDER — CLONAZEPAM 1 MG PO TABS: ORAL_TABLET | ORAL | 0 refills | Status: DC

## 2021-10-09 ENCOUNTER — Encounter: Payer: Self-pay | Admitting: Family Medicine

## 2021-10-09 DIAGNOSIS — G8929 Other chronic pain: Secondary | ICD-10-CM

## 2021-10-10 MED ORDER — IBUPROFEN 800 MG PO TABS: 800.0000 mg | ORAL_TABLET | Freq: Three times a day (TID) | ORAL | 2 refills | Status: DC | PRN

## 2021-12-14 ENCOUNTER — Other Ambulatory Visit: Payer: Self-pay | Admitting: Family Medicine

## 2021-12-14 DIAGNOSIS — F5101 Primary insomnia: Secondary | ICD-10-CM

## 2021-12-14 DIAGNOSIS — F411 Generalized anxiety disorder: Secondary | ICD-10-CM

## 2021-12-14 MED ORDER — CLONAZEPAM 1 MG PO TABS: ORAL_TABLET | ORAL | 0 refills | Status: DC

## 2022-01-02 ENCOUNTER — Encounter: Payer: Self-pay | Admitting: Family Medicine

## 2022-01-03 ENCOUNTER — Other Ambulatory Visit: Payer: Self-pay

## 2022-01-03 DIAGNOSIS — F411 Generalized anxiety disorder: Secondary | ICD-10-CM

## 2022-01-03 MED ORDER — FLUOXETINE HCL 40 MG PO CAPS: ORAL_CAPSULE | ORAL | 0 refills | Status: DC

## 2022-01-03 NOTE — Telephone Encounter (Signed)
Med has been filled.

## 2022-01-04 NOTE — Telephone Encounter (Signed)
Pt states [facility] told her they will not fill rx till April. Please advise.

## 2022-03-08 ENCOUNTER — Encounter: Payer: Self-pay | Admitting: Family Medicine

## 2022-03-08 ENCOUNTER — Other Ambulatory Visit: Payer: Self-pay | Admitting: Family Medicine

## 2022-03-08 DIAGNOSIS — F411 Generalized anxiety disorder: Secondary | ICD-10-CM

## 2022-03-08 DIAGNOSIS — F5101 Primary insomnia: Secondary | ICD-10-CM

## 2022-03-08 MED ORDER — CLONAZEPAM 1 MG PO TABS: ORAL_TABLET | ORAL | 0 refills | Status: DC

## 2022-03-12 ENCOUNTER — Other Ambulatory Visit: Payer: Self-pay | Admitting: Family Medicine

## 2022-03-12 DIAGNOSIS — F411 Generalized anxiety disorder: Secondary | ICD-10-CM

## 2022-05-09 ENCOUNTER — Encounter: Payer: Self-pay | Admitting: Family Medicine

## 2022-05-15 NOTE — Patient Instructions (Incomplete)
It was great to see you again today, I will be in touch with your labs as soon as possible  Please set up with the counselor of your choice to discuss depression and anxiety You might consult with [facility] Address: [address], [place], [place] [address] Phone: [phone]  If you find your symptoms are so severe that you cannot work going forward we will need to get you set up with a psychiatrist asap   Please see me in 6 months assuming you are doing ok

## 2022-05-15 NOTE — Progress Notes (Unsigned)
[place] at [facility] [address], [place], [place] [address] [phone]  Date:  05/18/2022   Name:  Melinda Ferrell   DOB:  23-Aug-1961   MRN:  [id number]  PCP:  Darreld Mclean, MD    Chief Complaint: No chief complaint on file.   History of Present Illness:  Melinda Ferrell is a 61 y.o. very pleasant female patient who presents with the following:  Patient seen today for physical exam Most recent visit with myself was in July last year History of insomnia, dysmenorrhea, blood clotting disorder.  Prior history of breast cancer 2004 and alcohol abuse  At her last visit she was taking fluoxetine successfully for anxiety.  She has uses clonazepam most nights for insomnia She works for the [employer], Clinical research associate pieces  Pap smear-per Dr. Charlesetta Garibaldi Mammogram-per Dr. Charlesetta Garibaldi COVID-19 booster Colon cancer screening due next year Lab work done last July, LFTs were elevated at that time.  She did not come back for follow-up as of yet-may have been done by her GYN?  Also vitamin D slightly low last year She is a former smoker-pack years????  Fluoxetine 40 Clonazepam 1 mg Patient Active Problem List   Diagnosis Date Noted   Insomnia 05/05/2016   Dysmenorrhea 11/19/2012    Past Medical History:  Diagnosis Date   Alcohol abuse    resolved 2012   Allergy    Cancer [facility]) 2004   breast- left   Clotting disorder [facility])     Past Surgical History:  Procedure Laterality Date   APPENDECTOMY     BREAST SURGERY Left 2004   had chemo and radiation    Social History   Tobacco Use   Smoking status: Former    Packs/day: 0.20    Types: Cigarettes    Quit date: 11/21/2015    Years since quitting: 6.4   Smokeless tobacco: Never  Substance Use Topics   Alcohol use: No    Alcohol/week: 0.0 standard drinks of alcohol   Drug use: No    Family History  Problem Relation Age of Onset   Hypertension Mother    Gout  Mother    Diabetes Father    Hyperlipidemia Father    Hypertension Father     Allergies  Allergen Reactions   Sulfa Antibiotics     Medication list has been reviewed and updated.  Current Outpatient Medications on File Prior to Visit  Medication Sig Dispense Refill   cetirizine (ZYRTEC) 10 MG tablet Take 10 mg by mouth daily.     clonazePAM (KLONOPIN) 1 MG tablet TAKE 1/2 TABLET BY MOUTH BY MOUTH DURING THE DAY AND 1 TABLET BY MOUTH AT BEDTIME AS NEEDED 135 tablet 0   diphenhydrAMINE (BENADRYL) 25 MG tablet Take 25 mg by mouth every 6 (six) hours as needed.     FLUoxetine (PROZAC) 40 MG capsule TAKE 1 CAPSULE BY MOUTH DAILY 90 capsule 3   ibuprofen (ADVIL) 800 MG tablet Take 1 tablet (800 mg total) by mouth every 8 (eight) hours as needed. 180 tablet 2   potassium chloride SA (KLOR-CON) 20 MEQ tablet Take 1 tablet (20 mEq total) by mouth daily. Take for 5 days 5 tablet 3   No current facility-administered medications on file prior to visit.    Review of Systems:  As per HPI- otherwise negative.   Physical Examination: There were no vitals filed for this visit. There were no vitals filed  for this visit. There is no height or weight on file to calculate BMI. Ideal Body Weight:    GEN: no acute distress. HEENT: Atraumatic, Normocephalic.  Ears and Nose: No external deformity. CV: RRR, No M/G/R. No JVD. No thrill. No extra heart sounds. PULM: CTA B, no wheezes, crackles, rhonchi. No retractions. No resp. distress. No accessory muscle use. ABD: S, NT, ND, +BS. No rebound. No HSM. EXTR: No c/c/e PSYCH: Normally interactive. Conversant.    Assessment and Plan: *** Physical exam today.  Encouraged healthy diet and exercise routine Signed Lamar Blinks, MD

## 2022-05-18 ENCOUNTER — Encounter: Payer: Self-pay | Admitting: Family Medicine

## 2022-05-18 ENCOUNTER — Ambulatory Visit (INDEPENDENT_AMBULATORY_CARE_PROVIDER_SITE_OTHER): Payer: 59 | Admitting: Family Medicine

## 2022-05-18 VITALS — BP 124/74 | HR 74 | Temp 98.6°F | Resp 18 | Ht 62.5 in | Wt 173.6 lb

## 2022-05-18 DIAGNOSIS — Z122 Encounter for screening for malignant neoplasm of respiratory organs: Secondary | ICD-10-CM

## 2022-05-18 DIAGNOSIS — F5101 Primary insomnia: Secondary | ICD-10-CM

## 2022-05-18 DIAGNOSIS — Z130 Encounter for screening for diseases of the blood and blood-forming organs and certain disorders involving the immune mechanism: Secondary | ICD-10-CM | POA: Diagnosis not present

## 2022-05-18 DIAGNOSIS — Z131 Encounter for screening for diabetes mellitus: Secondary | ICD-10-CM

## 2022-05-18 DIAGNOSIS — R7401 Elevation of levels of liver transaminase levels: Secondary | ICD-10-CM | POA: Diagnosis not present

## 2022-05-18 DIAGNOSIS — Z0000 Encounter for general adult medical examination without abnormal findings: Secondary | ICD-10-CM

## 2022-05-18 DIAGNOSIS — Z13220 Encounter for screening for lipoid disorders: Secondary | ICD-10-CM | POA: Diagnosis not present

## 2022-05-18 DIAGNOSIS — F411 Generalized anxiety disorder: Secondary | ICD-10-CM

## 2022-05-18 DIAGNOSIS — Z1329 Encounter for screening for other suspected endocrine disorder: Secondary | ICD-10-CM

## 2022-05-18 DIAGNOSIS — E559 Vitamin D deficiency, unspecified: Secondary | ICD-10-CM

## 2022-05-18 LAB — COMPREHENSIVE METABOLIC PANEL
ALT: 16 U/L (ref 0–35)
AST: 19 U/L (ref 0–37)
Albumin: 3.9 g/dL (ref 3.5–5.2)
Alkaline Phosphatase: 74 U/L (ref 39–117)
BUN: 18 mg/dL (ref 6–23)
CO2: 27 mEq/L (ref 19–32)
Calcium: 8.9 mg/dL (ref 8.4–10.5)
Chloride: 105 mEq/L (ref 96–112)
Creatinine, Ser: 0.71 mg/dL (ref 0.40–1.20)
GFR: 93.33 mL/min (ref >60.00)
Glucose, Bld: 96 mg/dL (ref 70–99)
Potassium: 3.6 mEq/L (ref 3.5–5.1)
Sodium: 140 mEq/L (ref 135–145)
Total Bilirubin: 0.4 mg/dL (ref 0.2–1.2)
Total Protein: 6.8 g/dL (ref 6.0–8.3)

## 2022-05-18 LAB — LIPID PANEL
Cholesterol: 195 mg/dL (ref 0–200)
HDL: 69.8 mg/dL (ref >39.00)
LDL Cholesterol: 102 mg/dL — ABNORMAL HIGH (ref 0–99)
NonHDL: 124.85
Total CHOL/HDL Ratio: 3
Triglycerides: 115 mg/dL (ref 0.0–149.0)
VLDL: 23 mg/dL (ref 0.0–40.0)

## 2022-05-18 LAB — CBC
HCT: 34.2 % — ABNORMAL LOW (ref 36.0–46.0)
Hemoglobin: 11.4 g/dL — ABNORMAL LOW (ref 12.0–15.0)
MCHC: 33.3 g/dL (ref 30.0–36.0)
MCV: 91.3 fl (ref 78.0–100.0)
Platelets: 332 10*3/uL (ref 150.0–400.0)
RBC: 3.74 Mil/uL — ABNORMAL LOW (ref 3.87–5.11)
RDW: 14.2 % (ref 11.5–15.5)
WBC: 3.8 10*3/uL — ABNORMAL LOW (ref 4.0–10.5)

## 2022-05-18 LAB — HEMOGLOBIN A1C: Hgb A1c MFr Bld: 5.2 % (ref 4.6–6.5)

## 2022-05-19 ENCOUNTER — Encounter: Payer: Self-pay | Admitting: Family Medicine

## 2022-06-12 ENCOUNTER — Other Ambulatory Visit: Payer: Self-pay | Admitting: Family Medicine

## 2022-06-12 DIAGNOSIS — F5101 Primary insomnia: Secondary | ICD-10-CM

## 2022-06-12 DIAGNOSIS — F411 Generalized anxiety disorder: Secondary | ICD-10-CM

## 2022-06-13 MED ORDER — CLONAZEPAM 1 MG PO TABS: ORAL_TABLET | ORAL | 1 refills | Status: DC

## 2022-06-17 ENCOUNTER — Ambulatory Visit (HOSPITAL_BASED_OUTPATIENT_CLINIC_OR_DEPARTMENT_OTHER)
Admission: RE | Admit: 2022-06-17 | Discharge: 2022-06-17 | Disposition: A | Discharge status: Home / Self Care | Payer: 59 | Source: Ambulatory Visit | Attending: Family Medicine | Admitting: Family Medicine

## 2022-06-17 ENCOUNTER — Encounter (HOSPITAL_BASED_OUTPATIENT_CLINIC_OR_DEPARTMENT_OTHER): Payer: Self-pay

## 2022-06-17 DIAGNOSIS — Z122 Encounter for screening for malignant neoplasm of respiratory organs: Secondary | ICD-10-CM

## 2022-06-22 ENCOUNTER — Encounter: Payer: Self-pay | Admitting: Family Medicine

## 2022-07-28 LAB — RESULTS CONSOLE HPV: CHL HPV: NEGATIVE

## 2022-07-28 LAB — HM MAMMOGRAPHY

## 2022-07-28 LAB — HM PAP SMEAR: HM Pap smear: NORMAL

## 2022-12-02 IMAGING — CT CT IMAGE GUIDED DRAINAGE BY PERCUTANEOUS CATHETER
1 of 2 series · 15 of 32 positions shown, 19 images · non-contrast
Comparison: none

MEDICATIONS:
None indicated

ANESTHESIA/SEDATION:
None required

INDICATION: Persistent painless serosanguineous outflow from subcutaneous
surgical drain catheter 6 weeks post procedure, presumably lymphatic
in etiology. Sclerosis requested.

EXAM:
CT GUIDED DRAIN CATHETER REVISION AND ETHANOL SCLEROSIS
TECHNIQUE: Informed written consent was obtained from the patient after a
thorough discussion of the procedural risks, benefits and
alternatives. All questions were addressed. Maximal Sterile Barrier
Technique was utilized including caps, mask, sterile gowns, sterile
gloves, sterile drape, hand hygiene and skin antiseptic. A timeout
was performed prior to the initiation of the procedure.

[Series 2: i-spiral 5.0 bf37 · axial · 0.97mm/px · z∈[-169,-54]mm · 15 of 37 slices shown, 19 images]
[im 2/37  soft-tissue]
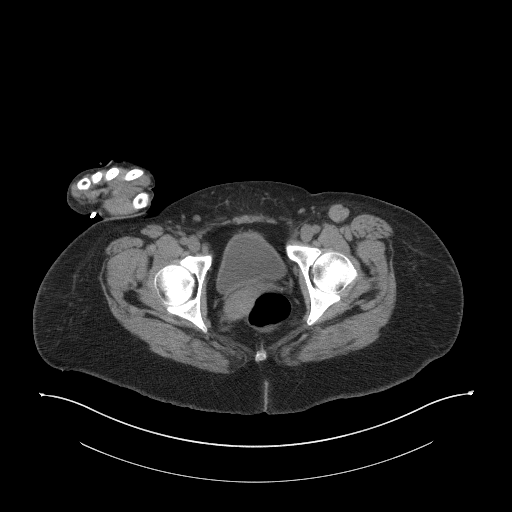
[im 2/37  bone]
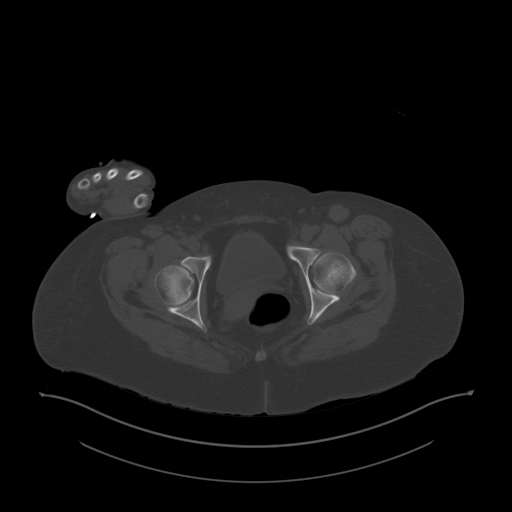
[im 5/37  soft-tissue]
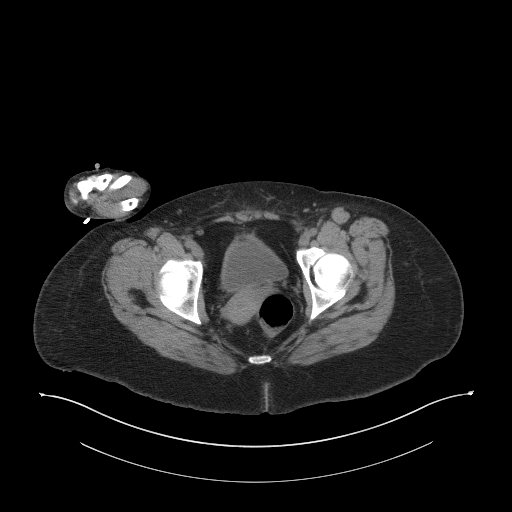
[im 8/37  soft-tissue]
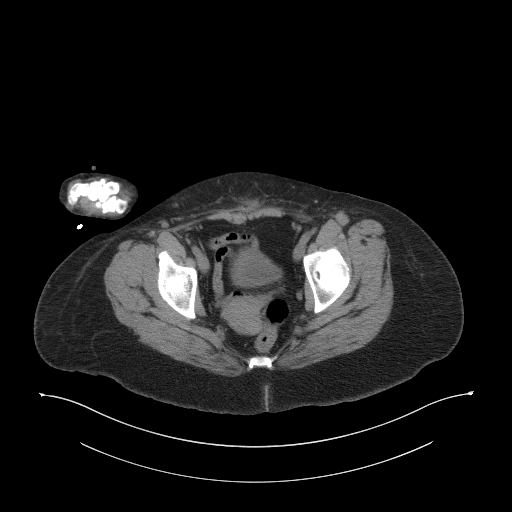
[im 11/37  soft-tissue]
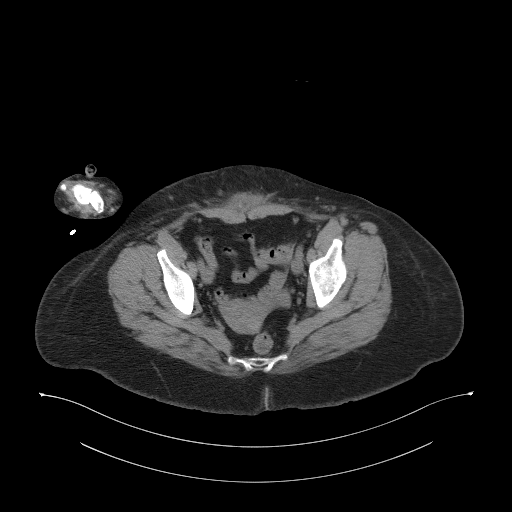
[im 13/37  soft-tissue]
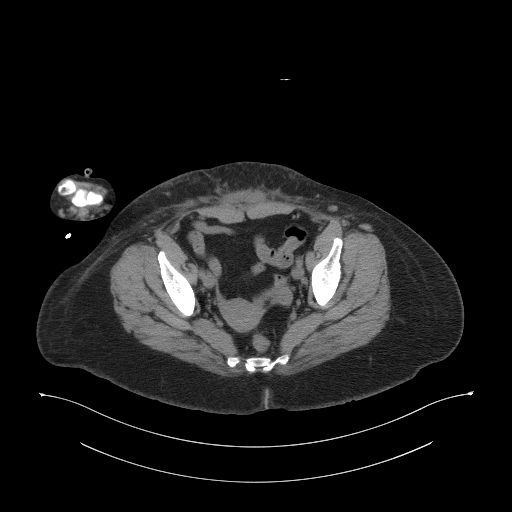
[im 16/37  soft-tissue]
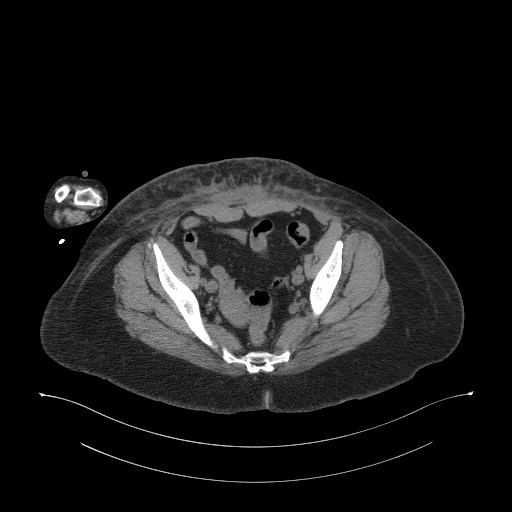
[im 19/37  soft-tissue]
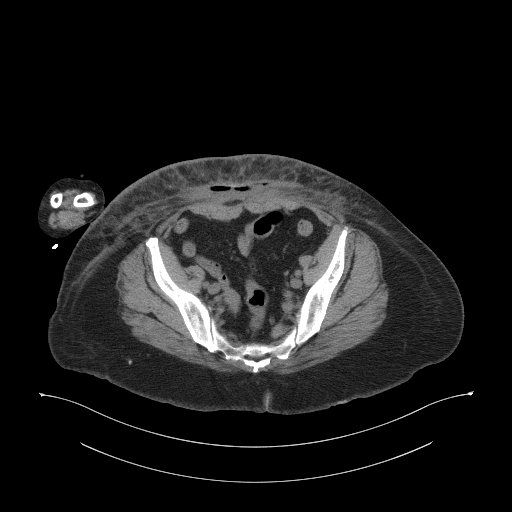
[im 21/37  soft-tissue]
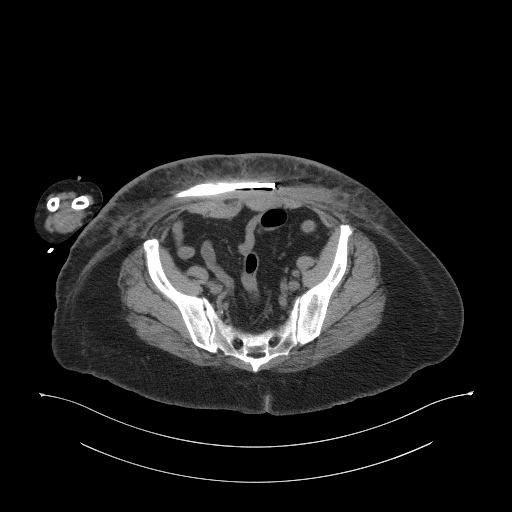
[im 24/37  soft-tissue]
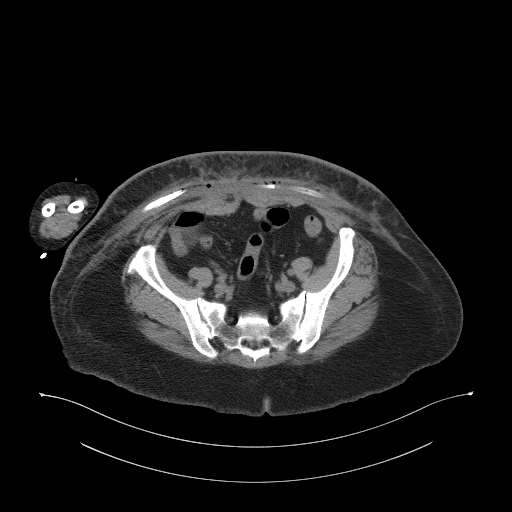
[im 24/37  bone]
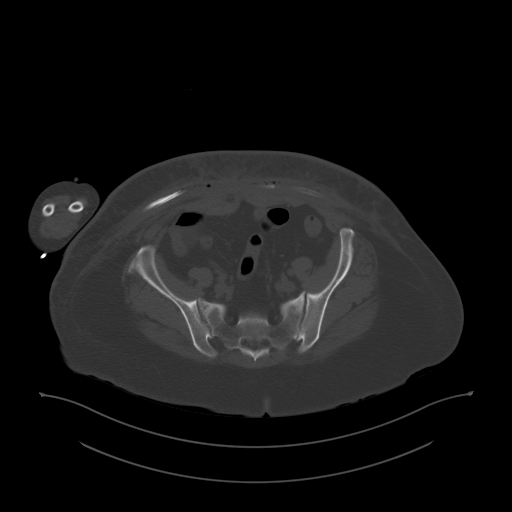
[im 26/37  soft-tissue]
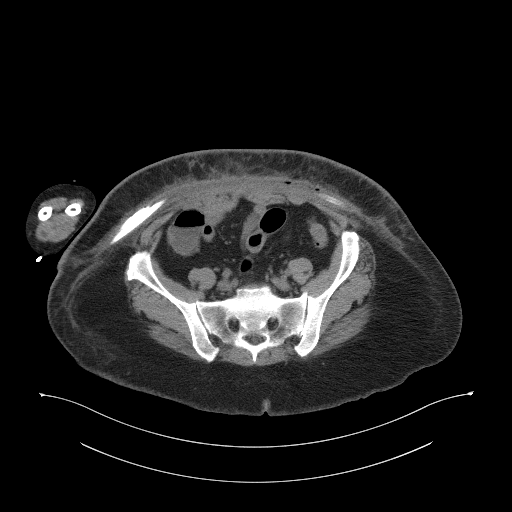
[im 29/37  soft-tissue]
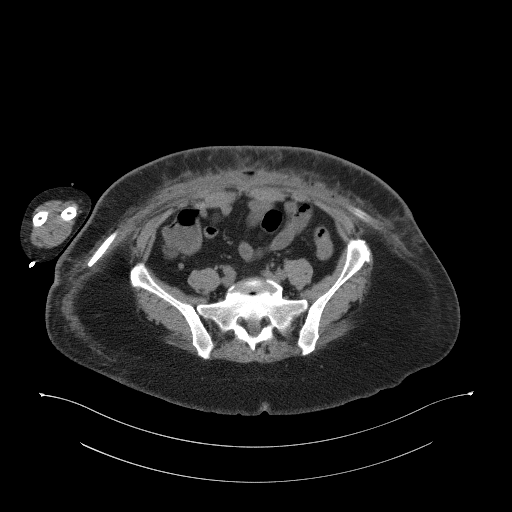
[im 31/37  lung]
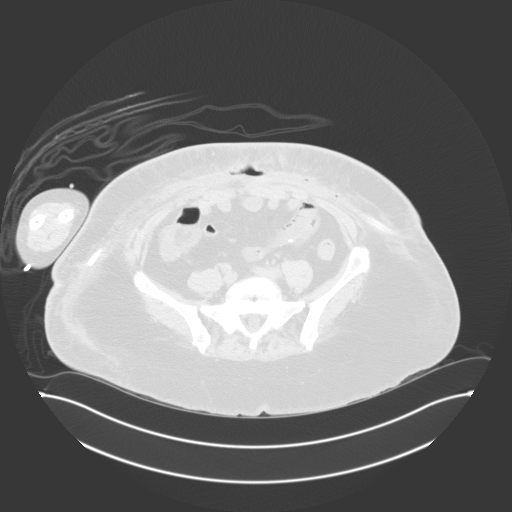
[im 32/37  soft-tissue]
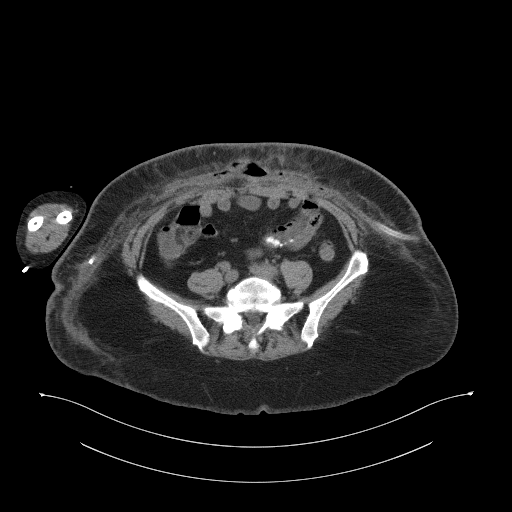
[im 32/37  lung]
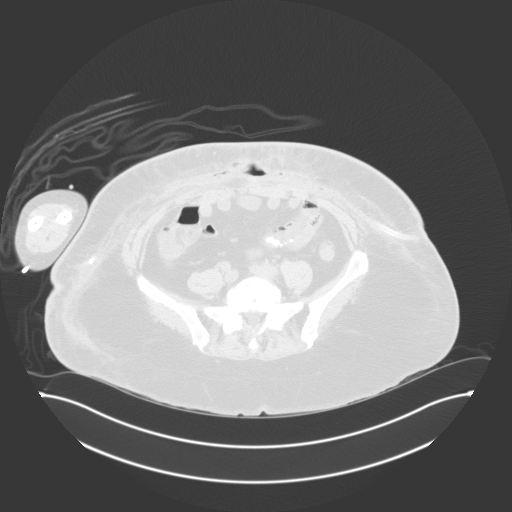
[im 34/37  lung]
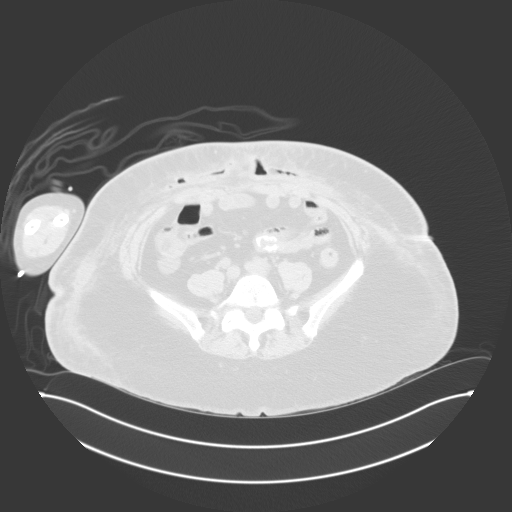
[im 35/37  soft-tissue]
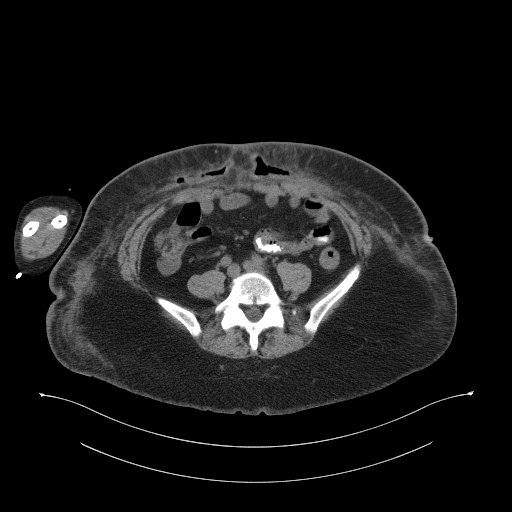
[im 35/37  lung]
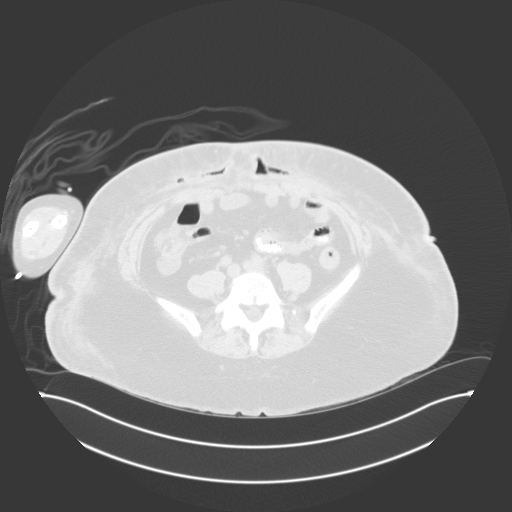

[15 of 32 positions shown; findings below may reference images not displayed]

Survey CT of the lower abdomen was performed and the left lower
quadrant drain catheter was localized. Aspiration returned no fluid.
The catheter was measured at 15 French outer diameter. Under
intermittent CT fluoroscopic guidance, catheter was cut and
exchanged over an Amplatz wire for a 16 French pigtail drain
catheter, formed centrally within the residual collection. Catheter
was secured externally with 0 Prolene suture. 30 mL dehydrated
ethanol was administered and left in place for 20 minutes with the
patient rotated through 360 degrees. The ethanol was aspirated, and
the catheter returned to gravity drainage. The patient tolerated the
procedure well.

COMPLICATIONS:
None immediate.
FINDINGS: Small volume residual gas and fluid in the deep subcutaneous body
wall collection. The surgical drain catheter was positioned
extending across midline.

The drain catheter was exchanged for a pigtail drain as above,
placed centrally in the residual collection, and ethanol sclerosis
performed as above.
IMPRESSION: 1. Technically successful revision of drain catheter under CT
guidance, with subsequent ethanol sclerosis.

PLAN:
Telehealth follow-up in 1 week with plans for drain removal once
output less than 20 mL per day.

Patient knows to call in the interim with any questions or problems.

## 2022-12-19 ENCOUNTER — Encounter: Payer: Self-pay | Admitting: Family Medicine

## 2022-12-19 DIAGNOSIS — F5101 Primary insomnia: Secondary | ICD-10-CM

## 2022-12-19 DIAGNOSIS — F411 Generalized anxiety disorder: Secondary | ICD-10-CM

## 2022-12-19 MED ORDER — CLONAZEPAM 1 MG PO TABS: ORAL_TABLET | ORAL | 0 refills | Status: DC

## 2023-01-01 ENCOUNTER — Other Ambulatory Visit: Payer: Self-pay | Admitting: Family Medicine

## 2023-01-01 DIAGNOSIS — F411 Generalized anxiety disorder: Secondary | ICD-10-CM

## 2023-05-21 NOTE — Progress Notes (Deleted)
[facility] at [facility] [address], [address] [place], [place] [address] [phone]  Date:  05/24/2023   Name:  Melinda Ferrell   DOB:  10/12/1961   MRN:  [id number]  PCP:  Pearline Cables, MD    Chief Complaint: No chief complaint on file.   History of Present Illness:  Melinda Ferrell is a 62 y.o. very pleasant female patient who presents with the following:  Patient seen today for physical exam Most recent visit with myself about 1 year ago History of insomnia, dysmenorrhea, blood clotting disorder, vitamin D deficiency.  Prior history of breast cancer 2004 and alcohol abuse   At her last visit she was taking fluoxetine successfully for anxiety.  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 Normand Sloop -most recent visit was in August of last year Labs 1 year ago   Clonazepam Fluoxetine    Patient Active Problem List   Diagnosis Date Noted   Insomnia 05/05/2016   Dysmenorrhea 11/19/2012    Past Medical History:  Diagnosis Date   Alcohol abuse    resolved 2012   Allergy    Cancer [facility]) 2004   breast- left   Clotting disorder [facility])     Past Surgical History:  Procedure Laterality Date   APPENDECTOMY     BREAST SURGERY Left 2004   had chemo and radiation    Social History   Tobacco Use   Smoking status: Former    Packs/day: .2    Types: Cigarettes    Quit date: 11/21/2015    Years since quitting: 7.5   Smokeless tobacco: Never  Substance Use Topics   Alcohol use: No    Alcohol/week: 0.0 standard drinks of alcohol   Drug use: No    Family History  Problem Relation Age of Onset   Hypertension Mother     Gout Mother    Diabetes Father    Hyperlipidemia Father    Hypertension Father     Allergies  Allergen Reactions   Sulfa Antibiotics     Medication list has been reviewed and updated.  Current Outpatient Medications on File Prior to Visit  Medication Sig Dispense Refill   cetirizine (ZYRTEC) 10 MG tablet Take 10 mg by mouth daily.     clonazePAM (KLONOPIN) 1 MG tablet TAKE 1/2 TABLET BY MOUTH BY MOUTH DURING THE DAY AND 1 TABLET BY MOUTH AT BEDTIME AS NEEDED 135 tablet 0   FLUoxetine (PROZAC) 40 MG capsule TAKE 1 CAPSULE BY MOUTH DAILY 90 capsule 3   No current facility-administered medications on file prior to visit.    Review of Systems:  ***  Physical Examination: There were no vitals filed for this visit. There were no vitals filed for this visit. There is no height or weight on file to calculate BMI. Ideal Body Weight:    ***  Assessment and Plan: ***  Signed Abbe Amsterdam, MD

## 2023-05-22 ENCOUNTER — Encounter: Payer: 59 | Admitting: Family Medicine

## 2023-05-23 ENCOUNTER — Encounter: Payer: Self-pay | Admitting: Family Medicine

## 2023-05-23 ENCOUNTER — Other Ambulatory Visit: Payer: Self-pay | Admitting: Family Medicine

## 2023-05-23 ENCOUNTER — Telehealth: Payer: Self-pay | Admitting: Family Medicine

## 2023-05-23 DIAGNOSIS — F5101 Primary insomnia: Secondary | ICD-10-CM

## 2023-05-23 DIAGNOSIS — F411 Generalized anxiety disorder: Secondary | ICD-10-CM

## 2023-05-23 MED ORDER — CLONAZEPAM 1 MG PO TABS
ORAL_TABLET | ORAL | 0 refills | Status: DC
Start: 2023-05-23 — End: 2023-06-12

## 2023-05-23 NOTE — Telephone Encounter (Signed)
Medication Refill  Medication: clonazePAM (KLONOPIN) 1 MG tablet   Pharmacy:  [id number] - [place], [place] - [address]    Last Visit: 6.14.23  Next Visit: 6.24.24  Patient did reschedule appointment due to provider being out of the office. Wants to know if she can get her refill since she was supposed to get it tomorrow. Please advise

## 2023-05-24 ENCOUNTER — Encounter: Payer: 59 | Admitting: Family Medicine

## 2023-05-24 DIAGNOSIS — Z131 Encounter for screening for diabetes mellitus: Secondary | ICD-10-CM

## 2023-05-24 DIAGNOSIS — Z1329 Encounter for screening for other suspected endocrine disorder: Secondary | ICD-10-CM

## 2023-05-24 DIAGNOSIS — Z130 Encounter for screening for diseases of the blood and blood-forming organs and certain disorders involving the immune mechanism: Secondary | ICD-10-CM

## 2023-05-24 DIAGNOSIS — Z0000 Encounter for general adult medical examination without abnormal findings: Secondary | ICD-10-CM

## 2023-05-24 DIAGNOSIS — E559 Vitamin D deficiency, unspecified: Secondary | ICD-10-CM

## 2023-05-24 DIAGNOSIS — F411 Generalized anxiety disorder: Secondary | ICD-10-CM

## 2023-05-24 DIAGNOSIS — F5101 Primary insomnia: Secondary | ICD-10-CM

## 2023-05-24 DIAGNOSIS — Z13220 Encounter for screening for lipoid disorders: Secondary | ICD-10-CM

## 2023-05-24 NOTE — Patient Instructions (Addendum)
It was great to see you again today, I will be in touch with your labs as soon as possible Let's try trazodone at bedtime for sleep- ok to take with a 1/2 clonazepam as needed.  Let me know how this works for you As we discussed, serotonin overload is a possible issue when combining fluoxetine and trazodone - this is rare but possible.  Watch out for  Agitation or restlessness Abnormal eye movements Diarrhea Fast heartbeat and high blood pressure Hallucinations Increased body temperature Loss of coordination Nausea and vomiting Overactive reflexes Rapid changes in blood pressure

## 2023-05-24 NOTE — Progress Notes (Signed)
[facility] at [facility] [address] [place], [place] [address] [phone]  Date:  05/29/2023   Name:  Melinda Ferrell   DOB:  1961/11/09   MRN:  [id number]  PCP:  Melinda Cables, MD    Chief Complaint: Annual Exam (Concerns/ questions: Melinda Ferrell: sees Melinda Ferrell- rec re sent/Pt would like to go up on Klonopin dosage )   History of Present Illness:  Melinda Ferrell is a 62 y.o. very pleasant female patient who presents with the following:  Patient seen today for physical exam Most recent visit with myself was in June of last year History of insomnia, dysmenorrhea, blood clotting disorder.  Prior history of breast cancer 2004 and alcohol abuse   At her visit last year she was planning to retire soon from the [employer] - she has a couple of months to go!  She is looking forward to retirement   She is fluoxetine for anxiety, clonazepam most nights for insomnia  GYN care per Melinda Ferrell- she also does her TSH  We tried to set her up for lung cancer screening CT last year, but it turned out she had not smoked enough to qualify  Pap screening- per GYN- records request sent  Mammogram- per GYN Colon cancer screening- this will be done in July  Can offer bone density Most recent labs 1 year ago, update today  Clonazepam- she is taking 1/2 in the am and 1mg  at bedtime.   However she notes continued difficulty with sleep  Fluoxetine 40- working well overall   Patient Active Problem List   Diagnosis Date Noted   Insomnia 05/05/2016   Dysmenorrhea 11/19/2012    Past Medical History:  Diagnosis Date   Alcohol abuse    resolved 2012   Allergy    Cancer [facility]) 2004   breast- left   Clotting disorder [facility])     Past Surgical History:  Procedure Laterality Date   APPENDECTOMY     BREAST SURGERY Left 2004   had chemo and radiation    Social History   Tobacco Use   Smoking status: Former    Packs/day: .2     Types: Cigarettes    Quit date: 11/21/2015    Years since quitting: 7.5   Smokeless tobacco: Never  Substance Use Topics   Alcohol use: No    Alcohol/week: 0.0 standard drinks of alcohol   Drug use: No    Family History  Problem Relation Age of Onset   Hypertension Mother    Gout Mother    Diabetes Father    Hyperlipidemia Father    Hypertension Father     Allergies  Allergen Reactions   Sulfa Antibiotics     Medication list has been reviewed and updated.  Current Outpatient Medications on File Prior to Visit  Medication Sig Dispense Refill   clonazePAM (KLONOPIN) 1 MG tablet TAKE 1/2 TABLET BY MOUTH BY MOUTH DURING THE DAY AND 1 TABLET BY MOUTH AT BEDTIME AS NEEDED 135 tablet 0   FLUoxetine (PROZAC) 40 MG capsule TAKE 1 CAPSULE BY MOUTH DAILY 90 capsule 3   cetirizine (ZYRTEC) 10 MG tablet Take 10 mg by mouth daily. (Patient not taking: Reported on 05/29/2023)     No current facility-administered medications on file prior to visit.    Review of Systems:  As per HPI- otherwise negative.   Physical Examination: Vitals:   05/29/23 1539  BP: 136/82  Pulse: (!) 103  Resp: 18  SpO2: 98%   Vitals:   05/29/23 1539  Weight: 170 lb 3.2 oz (77.2 kg)  Height: 5' 2.5" (1.588 m)   Body mass index is 30.63 kg/m. Ideal Body Weight: Weight in (lb) to have BMI = 25: 138.6 Recheck pulse 90 BPM   GEN: no acute distress. Mildly obese, looks well HEENT: Atraumatic, Normocephalic.  Ears and Nose: No external deformity. CV: RRR, No M/G/R. No JVD. No thrill. No extra heart sounds. PULM: CTA B, no wheezes, crackles, rhonchi. No retractions. No resp. distress. No accessory muscle use. ABD: S, NT, ND, +BS. No rebound. No HSM. EXTR: No c/c/e PSYCH: Normally interactive. Conversant.    Assessment and Plan: Physical exam  Primary insomnia - Plan: traZODone (DESYREL) 50 MG tablet  GAD (generalized anxiety disorder)  Screening for hyperlipidemia - Plan: Lipid panel,  CANCELED: Lipid panel  Screening for diabetes mellitus - Plan: Comprehensive metabolic panel, Hemoglobin A1c, CANCELED: Comprehensive metabolic panel, CANCELED: Hemoglobin A1c  Transaminitis - Plan: Comprehensive metabolic panel, CANCELED: Comprehensive metabolic panel  Screening for deficiency anemia - Plan: CBC, CANCELED: CBC  Screening for thyroid disorder  Vitamin D deficiency - Plan: CANCELED: VITAMIN D 25 Hydroxy (Vit-D Deficiency, Fractures)  Physical exam today.  Encouraged healthy diet and exercise routine Will plan further follow- up pending labs. Will have her try adding 25- 50 mg of trazodone at bedtime-  Let's try trazodone at bedtime for sleep- ok to take with a 1/2 clonazepam as needed.  Let me know how this works for you As we discussed, serotonin overload is a possible issue when combining fluoxetine and trazodone - this is rare but possible.  Watch out for     Signed Melinda Amsterdam, MD  Received her labs as below 6/25- message to pt Results for orders placed or performed in visit on 05/29/23  CBC  Result Value Ref Range   WBC 5.0 4.0 - 10.5 K/uL   RBC 3.97 3.87 - 5.11 Mil/uL   Platelets 421.0 (H) 150.0 - 400.0 K/uL   Hemoglobin 12.3 12.0 - 15.0 g/dL   HCT 16.1 09.6 - 04.5 %   MCV 94.4 78.0 - 100.0 fl   MCHC 32.8 30.0 - 36.0 g/dL   RDW 40.9 81.1 - 91.4 %  Comprehensive metabolic panel  Result Value Ref Range   Sodium 135 135 - 145 mEq/L   Potassium 3.8 3.5 - 5.1 mEq/L   Chloride 98 96 - 112 mEq/L   CO2 17 (L) 19 - 32 mEq/L   Glucose, Bld 57 (L) 70 - 99 mg/dL   BUN 17 6 - 23 mg/dL   Creatinine, Ser 7.82 0.40 - 1.20 mg/dL   Total Bilirubin 0.5 0.2 - 1.2 mg/dL   Alkaline Phosphatase 81 39 - 117 U/L   AST 44 (H) 0 - 37 U/L   ALT 30 0 - 35 U/L   Total Protein 7.6 6.0 - 8.3 g/dL   Albumin 4.0 3.5 - 5.2 g/dL   GFR 95.62 >13.08 mL/min   Calcium 8.8 8.4 - 10.5 mg/dL  Hemoglobin M5H  Result Value Ref Range   Hgb A1c MFr Bld 5.3 4.6 - 6.5 %  Lipid panel   Result Value Ref Range   Cholesterol 146 0 - 200 mg/dL   Triglycerides (H) 0.0 - 149.0 mg/dL    846.9 Triglyceride is over 400; calculations on Lipids are invalid.   HDL 48.90 >39.00 mg/dL   Total CHOL/HDL Ratio 3   LDL cholesterol,  direct  Result Value Ref Range   Direct LDL 41.0 mg/dL

## 2023-05-26 ENCOUNTER — Telehealth: Payer: Self-pay

## 2023-05-26 NOTE — Telephone Encounter (Signed)
Error

## 2023-05-29 ENCOUNTER — Ambulatory Visit (INDEPENDENT_AMBULATORY_CARE_PROVIDER_SITE_OTHER): Payer: 59 | Admitting: Family Medicine

## 2023-05-29 ENCOUNTER — Encounter: Payer: Self-pay | Admitting: Family Medicine

## 2023-05-29 VITALS — BP 136/82 | HR 103 | Resp 18 | Ht 62.5 in | Wt 170.2 lb

## 2023-05-29 DIAGNOSIS — R7401 Elevation of levels of liver transaminase levels: Secondary | ICD-10-CM

## 2023-05-29 DIAGNOSIS — Z130 Encounter for screening for diseases of the blood and blood-forming organs and certain disorders involving the immune mechanism: Secondary | ICD-10-CM | POA: Diagnosis not present

## 2023-05-29 DIAGNOSIS — E559 Vitamin D deficiency, unspecified: Secondary | ICD-10-CM

## 2023-05-29 DIAGNOSIS — F411 Generalized anxiety disorder: Secondary | ICD-10-CM | POA: Diagnosis not present

## 2023-05-29 DIAGNOSIS — R7981 Abnormal blood-gas level: Secondary | ICD-10-CM

## 2023-05-29 DIAGNOSIS — Z13220 Encounter for screening for lipoid disorders: Secondary | ICD-10-CM

## 2023-05-29 DIAGNOSIS — Z131 Encounter for screening for diabetes mellitus: Secondary | ICD-10-CM

## 2023-05-29 DIAGNOSIS — Z0000 Encounter for general adult medical examination without abnormal findings: Secondary | ICD-10-CM

## 2023-05-29 DIAGNOSIS — F5101 Primary insomnia: Secondary | ICD-10-CM | POA: Diagnosis not present

## 2023-05-29 DIAGNOSIS — Z1329 Encounter for screening for other suspected endocrine disorder: Secondary | ICD-10-CM

## 2023-05-29 MED ORDER — TRAZODONE HCL 50 MG PO TABS
25.0000 mg | ORAL_TABLET | Freq: Every evening | ORAL | 3 refills | Status: DC | PRN
Start: 2023-05-29 — End: 2024-05-30

## 2023-05-30 ENCOUNTER — Encounter: Payer: Self-pay | Admitting: Family Medicine

## 2023-05-30 DIAGNOSIS — F5101 Primary insomnia: Secondary | ICD-10-CM

## 2023-05-30 DIAGNOSIS — F411 Generalized anxiety disorder: Secondary | ICD-10-CM

## 2023-05-30 DIAGNOSIS — E781 Pure hyperglyceridemia: Secondary | ICD-10-CM

## 2023-05-30 LAB — CBC
HCT: 37.5 % (ref 36.0–46.0)
Hemoglobin: 12.3 g/dL (ref 12.0–15.0)
MCHC: 32.8 g/dL (ref 30.0–36.0)
MCV: 94.4 fl (ref 78.0–100.0)
Platelets: 421 10*3/uL — ABNORMAL HIGH (ref 150.0–400.0)
RBC: 3.97 Mil/uL (ref 3.87–5.11)
RDW: 15.1 % (ref 11.5–15.5)
WBC: 5 10*3/uL (ref 4.0–10.5)

## 2023-05-30 LAB — LIPID PANEL
Cholesterol: 146 mg/dL (ref 0–200)
HDL: 48.9 mg/dL (ref >39.00)
Total CHOL/HDL Ratio: 3
Triglycerides: 415 mg/dL — ABNORMAL HIGH (ref 0.0–149.0)

## 2023-05-30 LAB — COMPREHENSIVE METABOLIC PANEL
ALT: 30 U/L (ref 0–35)
AST: 44 U/L — ABNORMAL HIGH (ref 0–37)
Albumin: 4 g/dL (ref 3.5–5.2)
Alkaline Phosphatase: 81 U/L (ref 39–117)
BUN: 17 mg/dL (ref 6–23)
CO2: 17 mEq/L — ABNORMAL LOW (ref 19–32)
Calcium: 8.8 mg/dL (ref 8.4–10.5)
Chloride: 98 mEq/L (ref 96–112)
Creatinine, Ser: 0.85 mg/dL (ref 0.40–1.20)
GFR: 74.66 mL/min (ref >60.00)
Glucose, Bld: 57 mg/dL — ABNORMAL LOW (ref 70–99)
Potassium: 3.8 mEq/L (ref 3.5–5.1)
Sodium: 135 mEq/L (ref 135–145)
Total Bilirubin: 0.5 mg/dL (ref 0.2–1.2)
Total Protein: 7.6 g/dL (ref 6.0–8.3)

## 2023-05-30 LAB — HEMOGLOBIN A1C: Hgb A1c MFr Bld: 5.3 % (ref 4.6–6.5)

## 2023-05-30 LAB — LDL CHOLESTEROL, DIRECT: Direct LDL: 41 mg/dL

## 2023-05-30 NOTE — Addendum Note (Signed)
Addended by: Abbe Amsterdam C on: 05/30/2023 12:43 PM   Modules accepted: Orders

## 2023-05-30 NOTE — Telephone Encounter (Signed)
PT called to advise that she only intended to cancel the vitamin D and thyroid labs because her insurance does not cover these but the lab tech accidentally cancelled the A1C and CMP labs. Advised that it looks as if the A1C and Comp metabolic panel labs (along with a few others that were read off to her) were drawn but that the provider has not had a chance to review the labs yet. Please call to discuss.

## 2023-05-31 NOTE — Telephone Encounter (Signed)
Labs are ordered 

## 2023-05-31 NOTE — Telephone Encounter (Signed)
Pt scheduled for labs on 8/28 to have cholesterol and liver rechecked. Please place orders for both labs.

## 2023-06-12 MED ORDER — CLONAZEPAM 1 MG PO TABS: ORAL_TABLET | ORAL | 0 refills | Status: DC

## 2023-06-12 NOTE — Addendum Note (Signed)
Addended by: Abbe Amsterdam C on: 06/12/2023 01:02 PM   Modules accepted: Orders

## 2023-06-14 LAB — HM COLONOSCOPY

## 2023-07-04 ENCOUNTER — Encounter: Payer: Self-pay | Admitting: Family Medicine

## 2023-07-04 DIAGNOSIS — F411 Generalized anxiety disorder: Secondary | ICD-10-CM

## 2023-07-17 MED ORDER — FLUOXETINE HCL 40 MG PO CAPS
ORAL_CAPSULE | ORAL | 3 refills | Status: DC
Start: 2023-07-17 — End: 2024-05-30

## 2023-07-17 NOTE — Addendum Note (Signed)
Addended by: Pearline Cables on: 07/17/2023 08:56 PM   Modules accepted: Orders

## 2023-08-02 ENCOUNTER — Other Ambulatory Visit: Payer: 59

## 2023-11-15 ENCOUNTER — Encounter: Payer: Self-pay | Admitting: Family Medicine

## 2023-11-15 DIAGNOSIS — F5101 Primary insomnia: Secondary | ICD-10-CM

## 2023-11-15 DIAGNOSIS — F411 Generalized anxiety disorder: Secondary | ICD-10-CM

## 2023-11-16 MED ORDER — CLONAZEPAM 1 MG PO TABS
ORAL_TABLET | ORAL | 0 refills | Status: DC
Start: 2023-11-16 — End: 2024-05-30

## 2023-12-17 ENCOUNTER — Encounter: Payer: Self-pay | Admitting: Family Medicine

## 2023-12-18 NOTE — Telephone Encounter (Signed)
 Okay for refill?

## 2024-05-26 NOTE — Patient Instructions (Signed)
 It was good to see you again today, I will be in touch with your lab results

## 2024-05-26 NOTE — Progress Notes (Unsigned)
 [facility] at [facility] [address], [address] [place], [place] [address] [phone]  Date:  05/30/2024   Name:  Melinda Ferrell   DOB:  06/26/1961   MRN:  [id number]  PCP:  Watt Harlene BROCKS, MD    Chief Complaint: No chief complaint on file.   History of Present Illness:  Melinda Ferrell is a 63 y.o. very pleasant female patient who presents with the following:  Patient seen today for physical exam.  Most recent visit with myself was about 1 year ago History of insomnia, dysmenorrhea, blood clotting disorder.  Prior history of breast cancer 2004 and alcohol  abuse  She uses fluoxetine  for anxiety, clonazepam  at bedtime for insomnia and during the day as needed At her visit last year she complained of persistent insomnia symptoms, we added trazodone   She does have GYN care with Dr. Armond, was seen in August  Labs updated 1 year ago-  Colonoscopy 2019-?  Where she is on 5 or 10-year recheck  Patient Active Problem List   Diagnosis Date Noted   Insomnia 05/05/2016   Dysmenorrhea 11/19/2012    Past Medical History:  Diagnosis Date   Alcohol  abuse    resolved 2012   Allergy    Cancer [facility]) 2004   breast- left   Clotting disorder [facility])     Past Surgical History:  Procedure Laterality Date   APPENDECTOMY     BREAST SURGERY Left 2004   had chemo and radiation    Social History   Tobacco Use   Smoking status: Former    Current packs/day: 0.00    Types: Cigarettes    Quit date: 11/21/2015    Years since quitting: 8.5   Smokeless tobacco: Never  Substance Use Topics   Alcohol  use: No    Alcohol /week: 0.0 standard drinks of alcohol    Drug use: No    Family History  Problem Relation Age of Onset   Hypertension Mother    Gout Mother    Diabetes Father    Hyperlipidemia Father    Hypertension Father     Allergies  Allergen Reactions   Sulfa Antibiotics     Medication list has been reviewed and  updated.  Current Outpatient Medications on File Prior to Visit  Medication Sig Dispense Refill   cetirizine (ZYRTEC) 10 MG tablet Take 10 mg by mouth daily. (Patient not taking: Reported on 05/29/2023)     clonazePAM  (KLONOPIN ) 1 MG tablet TAKE 1/2 TABLET BY MOUTH BY MOUTH DURING THE DAY AND 2 TABLETS BY MOUTH AT BEDTIME AS NEEDED 225 tablet 0   FLUoxetine  (PROZAC ) 40 MG capsule TAKE 1 CAPSULE BY MOUTH DAILY 90 capsule 3   traZODone  (DESYREL ) 50 MG tablet Take 0.5-1 tablets (25-50 mg total) by mouth at bedtime as needed for sleep. 30 tablet 3   No current facility-administered medications on file prior to visit.    Review of Systems:  As per HPI- otherwise negative.   Physical Examination: There were no vitals filed for this visit. There were no vitals filed for this visit. There is no height or weight on file to calculate BMI. Ideal Body Weight:    GEN: no acute distress. HEENT: Atraumatic, Normocephalic.  Ears and Nose: No external deformity. CV: RRR, No M/G/R. No JVD. No thrill. No extra heart sounds. PULM: CTA B, no wheezes, crackles, rhonchi. No retractions. No resp. distress. No accessory muscle use. ABD: S, NT, ND, +BS. No rebound.  No HSM. EXTR: No c/c/e PSYCH: Normally interactive. Conversant.    Assessment and Plan: *** Physical exam today.  Encouraged healthy diet and exercise routine Will plan further follow- up pending labs.  Signed Harlene Schroeder, MD

## 2024-05-30 ENCOUNTER — Ambulatory Visit: Admitting: Family Medicine

## 2024-05-30 ENCOUNTER — Encounter: Payer: Self-pay | Admitting: Family Medicine

## 2024-05-30 VITALS — BP 120/74 | HR 85 | Ht 62.5 in | Wt 176.0 lb

## 2024-05-30 DIAGNOSIS — Z0000 Encounter for general adult medical examination without abnormal findings: Secondary | ICD-10-CM | POA: Diagnosis not present

## 2024-05-30 DIAGNOSIS — E66811 Obesity, class 1: Secondary | ICD-10-CM

## 2024-05-30 DIAGNOSIS — E785 Hyperlipidemia, unspecified: Secondary | ICD-10-CM

## 2024-05-30 DIAGNOSIS — Z130 Encounter for screening for diseases of the blood and blood-forming organs and certain disorders involving the immune mechanism: Secondary | ICD-10-CM | POA: Diagnosis not present

## 2024-05-30 DIAGNOSIS — E559 Vitamin D deficiency, unspecified: Secondary | ICD-10-CM

## 2024-05-30 DIAGNOSIS — Z13220 Encounter for screening for lipoid disorders: Secondary | ICD-10-CM

## 2024-05-30 DIAGNOSIS — Z1329 Encounter for screening for other suspected endocrine disorder: Secondary | ICD-10-CM

## 2024-05-30 DIAGNOSIS — F411 Generalized anxiety disorder: Secondary | ICD-10-CM

## 2024-05-30 DIAGNOSIS — Z131 Encounter for screening for diabetes mellitus: Secondary | ICD-10-CM

## 2024-05-30 DIAGNOSIS — F5101 Primary insomnia: Secondary | ICD-10-CM

## 2024-05-30 LAB — COMPREHENSIVE METABOLIC PANEL WITH GFR
ALT: 23 U/L (ref 0–35)
AST: 22 U/L (ref 0–37)
Albumin: 4.5 g/dL (ref 3.5–5.2)
Alkaline Phosphatase: 93 U/L (ref 39–117)
BUN: 14 mg/dL (ref 6–23)
CO2: 27 meq/L (ref 19–32)
Calcium: 10.1 mg/dL (ref 8.4–10.5)
Chloride: 103 meq/L (ref 96–112)
Creatinine, Ser: 0.81 mg/dL (ref 0.40–1.20)
GFR: 78.55 mL/min (ref >60.00)
Glucose, Bld: 104 mg/dL — ABNORMAL HIGH (ref 70–99)
Potassium: 4.2 meq/L (ref 3.5–5.1)
Sodium: 139 meq/L (ref 135–145)
Total Bilirubin: 0.4 mg/dL (ref 0.2–1.2)
Total Protein: 7.7 g/dL (ref 6.0–8.3)

## 2024-05-30 LAB — LIPID PANEL
Cholesterol: 266 mg/dL — ABNORMAL HIGH (ref 0–200)
HDL: 50.4 mg/dL (ref >39.00)
LDL Cholesterol: 163 mg/dL — ABNORMAL HIGH (ref 0–99)
NonHDL: 216.09
Total CHOL/HDL Ratio: 5
Triglycerides: 265 mg/dL — ABNORMAL HIGH (ref 0.0–149.0)
VLDL: 53 mg/dL — ABNORMAL HIGH (ref 0.0–40.0)

## 2024-05-30 LAB — CBC
HCT: 35.9 % — ABNORMAL LOW (ref 36.0–46.0)
Hemoglobin: 11.7 g/dL — ABNORMAL LOW (ref 12.0–15.0)
MCHC: 32.5 g/dL (ref 30.0–36.0)
MCV: 91.8 fl (ref 78.0–100.0)
Platelets: 419 10*3/uL — ABNORMAL HIGH (ref 150.0–400.0)
RBC: 3.91 Mil/uL (ref 3.87–5.11)
RDW: 14.9 % (ref 11.5–15.5)
WBC: 4.9 10*3/uL (ref 4.0–10.5)

## 2024-05-30 LAB — HEMOGLOBIN A1C: Hgb A1c MFr Bld: 5.4 % (ref 4.6–6.5)

## 2024-05-30 MED ORDER — PHENTERMINE HCL 15 MG PO CAPS: 15.0000 mg | ORAL_CAPSULE | ORAL | 0 refills | Status: DC

## 2024-05-30 MED ORDER — CLONAZEPAM 1 MG PO TABS: ORAL_TABLET | ORAL | 1 refills | Status: DC

## 2024-05-30 MED ORDER — FLUOXETINE HCL 40 MG PO CAPS: ORAL_CAPSULE | ORAL | 3 refills | Status: AC

## 2024-05-31 ENCOUNTER — Other Ambulatory Visit (HOSPITAL_COMMUNITY): Payer: Self-pay

## 2024-05-31 ENCOUNTER — Telehealth: Payer: Self-pay

## 2024-05-31 ENCOUNTER — Encounter: Payer: Self-pay | Admitting: Family Medicine

## 2024-05-31 MED ORDER — PHENTERMINE HCL 15 MG PO CAPS: 15.0000 mg | ORAL_CAPSULE | ORAL | 0 refills | Status: DC

## 2024-05-31 NOTE — Telephone Encounter (Signed)
 Needs PA for phentermine  please.

## 2024-05-31 NOTE — Telephone Encounter (Signed)
 Pharmacy Patient Advocate Encounter  Received notification from [employer] that Prior Authorization for Phentermine  15mg  caps has been APPROVED from 05/31/24 to 08/31/24   PA #/Case ID/Reference #:  [id number]

## 2024-05-31 NOTE — Telephone Encounter (Signed)
 Pharmacy Patient Advocate Encounter   Received notification from Pt Calls Messages that prior authorization for Phentermine  15mg  caps is required/requested.   Insurance verification completed.   The patient is insured through [facility] .   Per test claim: PA required; PA submitted to above mentioned insurance via CoverMyMeds Key/confirmation #/EOC [id number] Status is pending

## 2024-06-03 MED ORDER — ROSUVASTATIN CALCIUM 10 MG PO TABS: 10.0000 mg | ORAL_TABLET | Freq: Every day | ORAL | 3 refills | Status: AC

## 2024-06-03 NOTE — Addendum Note (Signed)
 Addended by: WATT RAISIN C on: 06/03/2024 04:29 PM   Modules accepted: Orders

## 2024-06-10 ENCOUNTER — Encounter: Payer: Self-pay | Admitting: Family Medicine

## 2024-06-21 ENCOUNTER — Other Ambulatory Visit (HOSPITAL_COMMUNITY): Payer: Self-pay

## 2024-07-16 ENCOUNTER — Encounter: Payer: Self-pay | Admitting: Family Medicine

## 2024-07-16 DIAGNOSIS — E66811 Obesity, class 1: Secondary | ICD-10-CM

## 2024-07-16 MED ORDER — PHENTERMINE HCL 37.5 MG PO CAPS: 37.5000 mg | ORAL_CAPSULE | ORAL | 1 refills | Status: AC

## 2024-10-12 ENCOUNTER — Encounter: Payer: Self-pay | Admitting: Family Medicine

## 2024-10-21 ENCOUNTER — Telehealth: Payer: Self-pay

## 2024-10-21 ENCOUNTER — Other Ambulatory Visit (HOSPITAL_COMMUNITY): Payer: Self-pay

## 2024-10-21 NOTE — Telephone Encounter (Signed)
 Pharmacy Patient Advocate Encounter   Received notification from Onbase that prior authorization for Phentermine  HCl 37.5MG  capsules   is required/requested.   Insurance verification completed.   The patient is insured through [facility].   Per test claim: PA required; PA submitted to above mentioned insurance via Latent Key/confirmation #/EOC [id number] Status is pending

## 2024-10-21 NOTE — Telephone Encounter (Signed)
 Copied from CRM #[id number]. Topic: Clinical - Medication Prior Auth >> Oct 21, 2024 10:50 AM Melinda Ferrell wrote: Reason for CRM: Patient is calling to follow up on the prior auth for phentermine , she sent a MyChart message and [employer] faxed a request to the office as well and have not heard back. She would like to receive the medication before thanksgiving and just wants an update.

## 2024-10-22 ENCOUNTER — Other Ambulatory Visit (HOSPITAL_COMMUNITY): Payer: Self-pay

## 2024-10-22 NOTE — Telephone Encounter (Signed)
 Needs updated OV for weight.

## 2024-10-22 NOTE — Telephone Encounter (Signed)
 Prior Authorization form/request asks a question that requires your assistance. Please see the question below and advise accordingly. The PA will not be submitted until the necessary information is received.  PLEASE BE ADVISED  Pt  will need a weight check to proceed  last weight check was 05/30/2024 that is documented in chart..  Needs to within 45 days. To answer this question below

## 2024-10-23 NOTE — Telephone Encounter (Signed)
 Pharmacy Patient Advocate Encounter  Received notification from [facility] that Prior Authorization for Phentermine  HCl 37.5MG  capsules  has been CANCELLED due to Patient requested to wait and see how she does at the end of taking med. We can revisit at that time to continue with PA.SABRA   PA #/Case ID/Reference #: [id number]

## 2024-11-10 ENCOUNTER — Other Ambulatory Visit: Payer: Self-pay | Admitting: Family Medicine

## 2024-11-10 DIAGNOSIS — F411 Generalized anxiety disorder: Secondary | ICD-10-CM

## 2024-11-10 DIAGNOSIS — F5101 Primary insomnia: Secondary | ICD-10-CM

## 2024-12-09 ENCOUNTER — Encounter: Payer: Self-pay | Admitting: Family Medicine
